# Patient Record
Sex: Female | Born: 1970 | Hispanic: No | Marital: Married | Smoking: Never smoker
Health system: Southern US, Community
[De-identification: ages and names within clinical notes are randomized; demographics above are authoritative.]

## PROBLEM LIST (undated history)

## (undated) DIAGNOSIS — E785 Hyperlipidemia, unspecified: Secondary | ICD-10-CM

## (undated) DIAGNOSIS — I10 Essential (primary) hypertension: Secondary | ICD-10-CM

## (undated) DIAGNOSIS — K219 Gastro-esophageal reflux disease without esophagitis: Secondary | ICD-10-CM

## (undated) HISTORY — DX: Gastro-esophageal reflux disease without esophagitis: K21.9

## (undated) HISTORY — DX: Essential (primary) hypertension: I10

## (undated) HISTORY — DX: Hyperlipidemia, unspecified: E78.5

---

## 2005-05-21 HISTORY — PX: CHOLECYSTECTOMY: SHX55

## 2007-12-10 ENCOUNTER — Ambulatory Visit: Payer: Self-pay | Admitting: Internal Medicine

## 2007-12-25 ENCOUNTER — Ambulatory Visit: Payer: Self-pay | Admitting: Internal Medicine

## 2008-01-26 ENCOUNTER — Ambulatory Visit: Payer: Self-pay | Admitting: Internal Medicine

## 2008-03-03 ENCOUNTER — Ambulatory Visit: Payer: Self-pay | Admitting: Internal Medicine

## 2008-07-28 ENCOUNTER — Ambulatory Visit: Payer: Self-pay | Admitting: Internal Medicine

## 2009-03-06 ENCOUNTER — Ambulatory Visit: Payer: Self-pay | Admitting: Internal Medicine

## 2009-03-28 ENCOUNTER — Ambulatory Visit: Payer: Self-pay | Admitting: Internal Medicine

## 2009-03-28 ENCOUNTER — Encounter: Admission: RE | Admit: 2009-03-28 | Discharge: 2009-03-28 | Payer: Self-pay | Admitting: Internal Medicine

## 2009-09-21 ENCOUNTER — Ambulatory Visit: Payer: Self-pay | Admitting: Internal Medicine

## 2009-11-06 ENCOUNTER — Ambulatory Visit: Payer: Self-pay | Admitting: Internal Medicine

## 2010-01-21 HISTORY — PX: BLADDER SUSPENSION: SHX72

## 2010-04-12 ENCOUNTER — Encounter: Payer: Self-pay | Admitting: Internal Medicine

## 2010-06-05 ENCOUNTER — Other Ambulatory Visit: Payer: Self-pay | Admitting: Internal Medicine

## 2010-06-14 ENCOUNTER — Other Ambulatory Visit: Payer: Self-pay | Admitting: *Deleted

## 2010-06-14 DIAGNOSIS — E785 Hyperlipidemia, unspecified: Secondary | ICD-10-CM

## 2010-06-14 MED ORDER — SIMVASTATIN 20 MG PO TABS: 20.0000 mg | ORAL_TABLET | Freq: Every evening | ORAL | Status: DC

## 2010-07-27 ENCOUNTER — Encounter: Payer: Self-pay | Admitting: Internal Medicine

## 2010-07-30 ENCOUNTER — Encounter: Payer: Self-pay | Admitting: Internal Medicine

## 2010-07-30 ENCOUNTER — Ambulatory Visit (INDEPENDENT_AMBULATORY_CARE_PROVIDER_SITE_OTHER): Payer: 59 | Admitting: Internal Medicine

## 2010-07-30 DIAGNOSIS — Z23 Encounter for immunization: Secondary | ICD-10-CM

## 2010-07-30 DIAGNOSIS — Z131 Encounter for screening for diabetes mellitus: Secondary | ICD-10-CM

## 2010-07-30 DIAGNOSIS — K219 Gastro-esophageal reflux disease without esophagitis: Secondary | ICD-10-CM

## 2010-07-30 DIAGNOSIS — D3500 Benign neoplasm of unspecified adrenal gland: Secondary | ICD-10-CM

## 2010-07-30 DIAGNOSIS — E785 Hyperlipidemia, unspecified: Secondary | ICD-10-CM

## 2010-07-30 DIAGNOSIS — F329 Major depressive disorder, single episode, unspecified: Secondary | ICD-10-CM

## 2010-07-30 DIAGNOSIS — E669 Obesity, unspecified: Secondary | ICD-10-CM

## 2010-07-30 DIAGNOSIS — Z0000 Encounter for general adult medical examination without abnormal findings: Secondary | ICD-10-CM

## 2010-07-30 DIAGNOSIS — K5900 Constipation, unspecified: Secondary | ICD-10-CM

## 2010-07-30 DIAGNOSIS — I10 Essential (primary) hypertension: Secondary | ICD-10-CM

## 2010-07-30 LAB — CBC WITH DIFFERENTIAL/PLATELET
Eosinophils Relative: 2 % (ref 0–5)
Hemoglobin: 12.4 g/dL (ref 12.0–15.0)
Lymphocytes Relative: 31 % (ref 12–46)
MCH: 26.8 pg (ref 26.0–34.0)
MCHC: 31.5 g/dL (ref 30.0–36.0)
MCV: 85.3 fL (ref 78.0–100.0)
Neutro Abs: 3.1 10*3/uL (ref 1.7–7.7)
Neutrophils Relative %: 59 % (ref 43–77)
RDW: 15.2 % (ref 11.5–15.5)
WBC: 5.3 10*3/uL (ref 4.0–10.5)

## 2010-07-30 LAB — LIPID PANEL
HDL: 55 mg/dL (ref >39)
Total CHOL/HDL Ratio: 4.6 Ratio
VLDL: 29 mg/dL (ref 0–40)

## 2010-07-30 LAB — HEPATIC FUNCTION PANEL
ALT: 55 U/L — ABNORMAL HIGH (ref 0–35)
Albumin: 4.6 g/dL (ref 3.5–5.2)
Bilirubin, Direct: 0.1 mg/dL (ref 0.0–0.3)
Total Bilirubin: 0.5 mg/dL (ref 0.3–1.2)

## 2010-07-30 LAB — POCT URINALYSIS DIPSTICK: pH, UA: 5

## 2010-07-30 MED ORDER — TETANUS-DIPHTH-ACELL PERTUSSIS 5-2.5-18.5 LF-MCG/0.5 IM SUSP
0.5000 mL | Freq: Once | INTRAMUSCULAR | Status: AC
  Administered 2010-07-30: 0.5 mL via INTRAMUSCULAR

## 2010-07-31 ENCOUNTER — Encounter: Payer: Self-pay | Admitting: Internal Medicine

## 2010-07-31 LAB — BASIC METABOLIC PANEL
CO2: 18 mEq/L — ABNORMAL LOW (ref 19–32)
Calcium: 10.1 mg/dL (ref 8.4–10.5)
Chloride: 101 mEq/L (ref 96–112)
Creat: 0.76 mg/dL (ref 0.50–1.10)
Glucose, Bld: 87 mg/dL (ref 70–99)
Potassium: 4.1 mEq/L (ref 3.5–5.3)
Sodium: 137 mEq/L (ref 135–145)

## 2010-07-31 LAB — VITAMIN D 25 HYDROXY (VIT D DEFICIENCY, FRACTURES): Vit D, 25-Hydroxy: 28 ng/mL — ABNORMAL LOW (ref 30–89)

## 2010-07-31 LAB — HEMOGLOBIN A1C
Hgb A1c MFr Bld: 6.1 % — ABNORMAL HIGH (ref <5.7)
Mean Plasma Glucose: 128 mg/dL — ABNORMAL HIGH (ref <117)

## 2010-08-20 DIAGNOSIS — I10 Essential (primary) hypertension: Secondary | ICD-10-CM | POA: Insufficient documentation

## 2010-08-20 DIAGNOSIS — D3500 Benign neoplasm of unspecified adrenal gland: Secondary | ICD-10-CM | POA: Insufficient documentation

## 2010-08-20 DIAGNOSIS — E785 Hyperlipidemia, unspecified: Secondary | ICD-10-CM | POA: Insufficient documentation

## 2010-08-20 DIAGNOSIS — K5900 Constipation, unspecified: Secondary | ICD-10-CM | POA: Insufficient documentation

## 2010-08-20 DIAGNOSIS — F329 Major depressive disorder, single episode, unspecified: Secondary | ICD-10-CM | POA: Insufficient documentation

## 2010-08-20 DIAGNOSIS — K219 Gastro-esophageal reflux disease without esophagitis: Secondary | ICD-10-CM | POA: Insufficient documentation

## 2010-08-20 NOTE — Progress Notes (Signed)
  Subjective:    Patient ID: Carolyn Combs, female    DOB: 1970/07/17, 40 y.o.   MRN: [id number]  HPI white female with history of hyperlipidemia hypertension obesity hypokalemia secondary to diuretic therapy and adult onset diabetes mellitus. History of depression [facility] with Effexor and Wellbutrin. No known drug allergies. Previous surgery included a vaginal sling. T.dap Vaccine given today. History of acute lower back pain 2011 resulting in a CT of the abdomen and pelvis since pain was radiating into her groin. No acute appendicitis. Patient was constipated. There is also a probable 2 cm adrenal adenoma left adrenal gland noted on CT in 2011.  She had her husband are separated. She completed 4 years of college and works as a Consulting civil engineer and a Horticulturist, commercial for [employer]. Does not smoke. Social alcohol consumption. 2 children.  Family history mother with history of breast cancer asthma hypertension COPD. Maternal grandmother with anxiety. Father in good health. One sister in good health.     Review of Systems  Constitutional: Positive for activity change and fatigue.  HENT: Negative.   Eyes: Negative.   Respiratory: Negative.   Cardiovascular: Negative.   Gastrointestinal: Positive for constipation.  Genitourinary: Negative.   Musculoskeletal: Negative.   Neurological: Negative.   Hematological: Negative.   Psychiatric/Behavioral: Positive for dysphoric mood.       Objective:   Physical Exam  Vitals reviewed. Constitutional: She is oriented to person, place, and time. She appears well-nourished.  HENT:  Head: Normocephalic and atraumatic.  Right Ear: External ear normal.  Left Ear: External ear normal.  Mouth/Throat: Oropharynx is clear and moist. No oropharyngeal exudate.  Eyes: Conjunctivae and EOM are normal. Pupils are equal, round, and reactive to light.  Neck: Neck supple. No JVD present. No thyromegaly present.  Cardiovascular: Normal  rate, regular rhythm and normal heart sounds.   Pulmonary/Chest: Effort normal and breath sounds normal. She has no wheezes. She has no rales.  Abdominal: Soft. Bowel sounds are normal.  Musculoskeletal: She exhibits no edema.  Lymphadenopathy:    She has no cervical adenopathy.  Neurological: She is alert and oriented to person, place, and time. She has normal reflexes. No cranial nerve deficit.  Skin: Skin is warm and dry. No rash noted. She is not diaphoretic.  Psychiatric: She has a normal mood and affect. Her behavior is normal.          Assessment & Plan:   Hypertension  Hyperlipidemia  GE reflux  Obesity  Hypokalemia secondary to diuretic therapy  Adult onset diabetes mellitus  Depression  Probable left adrenal adenoma  Return in 8 weeks. Need to reassess blood pressure. Consider increasing Ramipril to 10 mg daily. Under stress with separation

## 2010-08-31 ENCOUNTER — Other Ambulatory Visit: Payer: Self-pay | Admitting: Internal Medicine

## 2010-09-06 ENCOUNTER — Other Ambulatory Visit: Payer: Self-pay | Admitting: Internal Medicine

## 2010-09-11 ENCOUNTER — Encounter: Payer: Self-pay | Admitting: Internal Medicine

## 2010-09-11 ENCOUNTER — Ambulatory Visit
Admission: RE | Admit: 2010-09-11 | Discharge: 2010-09-11 | Disposition: A | Discharge status: Home / Self Care | Payer: 59 | Source: Ambulatory Visit | Attending: Internal Medicine | Admitting: Internal Medicine

## 2010-09-30 ENCOUNTER — Other Ambulatory Visit: Payer: Self-pay | Admitting: Internal Medicine

## 2010-10-01 ENCOUNTER — Other Ambulatory Visit: Payer: 59 | Admitting: Internal Medicine

## 2010-10-01 LAB — HEPATIC FUNCTION PANEL
Alkaline Phosphatase: 117 U/L (ref 39–117)
Bilirubin, Direct: 0.1 mg/dL (ref 0.0–0.3)

## 2010-10-02 ENCOUNTER — Ambulatory Visit (INDEPENDENT_AMBULATORY_CARE_PROVIDER_SITE_OTHER): Payer: 59 | Admitting: Internal Medicine

## 2010-10-02 ENCOUNTER — Encounter: Payer: Self-pay | Admitting: Internal Medicine

## 2010-10-02 VITALS — BP 129/89 | HR 82 | Temp 96.8°F | Ht 65.0 in | Wt 302.0 lb

## 2010-10-02 DIAGNOSIS — I10 Essential (primary) hypertension: Secondary | ICD-10-CM

## 2010-10-02 DIAGNOSIS — F3289 Other specified depressive episodes: Secondary | ICD-10-CM

## 2010-10-02 DIAGNOSIS — E785 Hyperlipidemia, unspecified: Secondary | ICD-10-CM

## 2010-10-02 DIAGNOSIS — F329 Major depressive disorder, single episode, unspecified: Secondary | ICD-10-CM

## 2010-10-02 NOTE — Progress Notes (Signed)
  Subjective:    Patient ID: Carolyn Combs, female    DOB: 06/13/1970, 40 y.o.   MRN: [id number]  HPI for followup of hypertension and depression. She is on Altace 10 mg daily, HCTZ, generic Zocor, Wellbutrin, lorazepam, and Effexor. History of GE reflux treated with Prilosec. Has 2 small children and is going through a divorce. Is morbidly obese. Not really motivated to diet and exercise which is concerning. Separated from husband. He has moved into another home and has a girlfriend. Patient has psychiatrist handling her antidepressant and anxiety medication.    Review of Systems     Objective:   Physical Exam chest clear to auscultation; cardiac exam regular rate and rhythm; extremities without edema.        Assessment & Plan:  Hypertension  Hyperlipidemia  Depression  Morbid obesity  GE reflux  Plan: Patient is return in 6 months

## 2010-10-14 ENCOUNTER — Other Ambulatory Visit: Payer: Self-pay | Admitting: Internal Medicine

## 2010-10-17 ENCOUNTER — Institutional Professional Consult (permissible substitution): Payer: 59 | Admitting: Pulmonary Disease

## 2010-10-31 ENCOUNTER — Ambulatory Visit (INDEPENDENT_AMBULATORY_CARE_PROVIDER_SITE_OTHER): Payer: 59 | Admitting: Pulmonary Disease

## 2010-10-31 ENCOUNTER — Encounter: Payer: Self-pay | Admitting: Pulmonary Disease

## 2010-10-31 VITALS — BP 118/84 | HR 92 | Temp 98.4°F | Ht 64.0 in | Wt 314.4 lb

## 2010-10-31 DIAGNOSIS — G4733 Obstructive sleep apnea (adult) (pediatric): Secondary | ICD-10-CM

## 2010-10-31 NOTE — Progress Notes (Signed)
  Subjective:    Patient ID: Carolyn Combs, female    DOB: [birth year], 40 y.o.   MRN: [id number]  HPI The patient is a 40 year old female who I have been asked to see for possible sleep apnea.  She has been noted to have loud snoring, as well as an abnormal breathing pattern during sleep according to her family.  She has frequent awakenings at night, and is not rested in the mornings upon arising.  She has significant sleep pressure during the day while at work, and dozes frequently at her computer.  She is not as sleepy in the evenings, but stays very busy taking care of her kids.  She notes some sleep pressure with driving, but it is not consistent.  She states that her weight is up 75 pounds over the last 2 years, and her Epworth score today is 16.  Sleep Questionnaire: What time do you typically go to bed?( Between what hours) 10:30 to 11:30 pm How long does it take you to fall asleep? 30 mins How many times during the night do you wake up? 7 What time do you get out of bed to start your day? 0600 Do you drive or operate heavy machinery in your occupation? No How much has your weight changed (up or down) over the past two years? (In pounds) 75 lb (34.02 kg) Have you ever had a sleep study before? No Do you currently use CPAP? No Do you wear oxygen at any time? No     Review of Systems  Constitutional: Positive for unexpected weight change. Negative for fever.  HENT: Negative for ear pain, nosebleeds, congestion, sore throat, rhinorrhea, sneezing, trouble swallowing, dental problem, postnasal drip and sinus pressure.   Eyes: Negative for redness and itching.  Respiratory: Positive for shortness of breath. Negative for cough, chest tightness and wheezing.   Cardiovascular: Negative for palpitations and leg swelling.  Gastrointestinal: Negative for nausea and vomiting.  Genitourinary: Negative for dysuria.  Musculoskeletal: Negative for joint swelling.  Skin: Negative for rash.  Neurological:  Negative for headaches.  Hematological: Does not bruise/bleed easily.  Psychiatric/Behavioral: Positive for dysphoric mood. The patient is nervous/anxious.        Objective:   Physical Exam Constitutional:  Obese female, no acute distress  HENT:  Nares patent without discharge  Oropharynx without exudate, palate and uvula are elongated, +tonsillar hypertrophy  Eyes:  Perrla, eomi, no scleral icterus  Neck:  No JVD, no TMG  Cardiovascular:  Normal rate, regular rhythm, no rubs or gallops.  No murmurs        Intact distal pulses  Pulmonary :  Normal breath sounds, no stridor or respiratory distress   No rales, rhonchi, or wheezing  Abdominal:  Soft, nondistended, bowel sounds present.  No tenderness noted.   Musculoskeletal:  No lower extremity edema noted.  Lymph Nodes:  No cervical lymphadenopathy noted  Skin:  No cyanosis noted  Neurologic:  Alert, appropriate, moves all 4 extremities without obvious deficit.         Assessment & Plan:

## 2010-10-31 NOTE — Patient Instructions (Signed)
Will set up for home sleep testing.  Will arrange followup visit to discuss once results available.

## 2010-10-31 NOTE — Assessment & Plan Note (Signed)
The patient's history is very suspicious for significant obstructive sleep apnea.  She has snoring, an abnormal breathing pattern during sleep, nonrestorative sleep, and significant daytime sleepiness.  I have had a long discussion with the pt about sleep apnea, including its impact on QOL and CV health.  I have recommended that she had a sleep study, and the patient is agreeable.  I will arrange followup once the results are available.

## 2010-11-03 ENCOUNTER — Other Ambulatory Visit: Payer: Self-pay | Admitting: Internal Medicine

## 2010-11-08 ENCOUNTER — Ambulatory Visit (INDEPENDENT_AMBULATORY_CARE_PROVIDER_SITE_OTHER): Payer: 59 | Admitting: Pulmonary Disease

## 2010-11-08 DIAGNOSIS — G4733 Obstructive sleep apnea (adult) (pediatric): Secondary | ICD-10-CM

## 2010-11-14 ENCOUNTER — Other Ambulatory Visit: Payer: Self-pay | Admitting: Internal Medicine

## 2010-11-15 ENCOUNTER — Other Ambulatory Visit: Payer: Self-pay

## 2010-11-15 MED ORDER — RAMIPRIL 10 MG PO CAPS: 10.0000 mg | ORAL_CAPSULE | Freq: Every day | ORAL | Status: DC

## 2010-11-16 ENCOUNTER — Telehealth: Payer: Self-pay | Admitting: *Deleted

## 2010-11-16 NOTE — Telephone Encounter (Signed)
Pt needs ov with KC to discuss sleep study results LMOM for pt TCB 

## 2010-11-19 NOTE — Telephone Encounter (Signed)
[id number]

## 2010-11-19 NOTE — Telephone Encounter (Signed)
Carolyn Combs will schedule the pt in a hold slot on kc's schedule per megan--nothing further needed for this msg

## 2010-11-20 ENCOUNTER — Encounter: Payer: Self-pay | Admitting: Pulmonary Disease

## 2010-11-20 ENCOUNTER — Ambulatory Visit (INDEPENDENT_AMBULATORY_CARE_PROVIDER_SITE_OTHER): Payer: 59 | Admitting: Pulmonary Disease

## 2010-11-20 VITALS — BP 112/68 | HR 97 | Temp 97.7°F | Ht 64.0 in | Wt 321.2 lb

## 2010-11-20 DIAGNOSIS — G4733 Obstructive sleep apnea (adult) (pediatric): Secondary | ICD-10-CM

## 2010-11-20 NOTE — Patient Instructions (Signed)
Will start on cpap at moderate pressure level.  Please call if having tolerance issues. Work on weight reduction followup with me in 5 weeks.

## 2010-11-20 NOTE — Progress Notes (Signed)
  Subjective:    Patient ID: Carolyn Combs, female    DOB: 09/07/1970, 40 y.o.   MRN: [id number]  HPI The patient comes in today for followup of her recent home sleep test.  She was found to have mild obstructive sleep apnea, with an apnea hypopnea index of 12 events per hour.  I have reviewed the study in detail with her, and answered all of her questions.   Review of Systems  Constitutional: Negative for fever and unexpected weight change.  HENT: Positive for sore throat, postnasal drip and sinus pressure. Negative for ear pain, nosebleeds, congestion, rhinorrhea, sneezing, trouble swallowing and dental problem.   Eyes: Negative for redness and itching.  Respiratory: Positive for cough. Negative for chest tightness, shortness of breath and wheezing.   Cardiovascular: Negative for palpitations and leg swelling.  Gastrointestinal: Negative for nausea and vomiting.  Genitourinary: Negative for dysuria.  Musculoskeletal: Negative for joint swelling.  Skin: Negative for rash.  Neurological: Negative for headaches.  Hematological: Does not bruise/bleed easily.  Psychiatric/Behavioral: Negative for dysphoric mood. The patient is not nervous/anxious.        Objective:   Physical Exam Obese female in no acute distress Nose without purulence or discharge noted Lower extremities without edema, no cyanosis Appears mildly sleepy, moves all 4 extremities.       Assessment & Plan:

## 2010-11-20 NOTE — Assessment & Plan Note (Signed)
The patient has mild obstructive sleep apnea by her recent sleep test, but has significant daytime symptoms and nonrestorative sleep.  I have outlined a conservative approach with a trial of weight loss alone, as well as a more aggressive approach with treatment while working on weight loss.  The patient feels that she would like to treat this because of her daytime symptoms, and I have discussed the options of surgery, dental appliance, and CPAP.  After a lengthy discussion, and the patient and I have decided that a trial of CPAP would be best while she is working on weight reduction. I will set the patient up on cpap at a moderate pressure level to allow for desensitization, and will troubleshoot the device over the next 4-6weeks if needed.  The pt is to call me if having issues with tolerance.  Will then optimize the pressure once patient is able to wear cpap on a consistent basis.

## 2010-12-25 ENCOUNTER — Ambulatory Visit: Payer: 59 | Admitting: Pulmonary Disease

## 2010-12-28 ENCOUNTER — Other Ambulatory Visit: Payer: Self-pay | Admitting: Internal Medicine

## 2011-01-11 ENCOUNTER — Ambulatory Visit (INDEPENDENT_AMBULATORY_CARE_PROVIDER_SITE_OTHER): Payer: 59 | Admitting: Pulmonary Disease

## 2011-01-11 ENCOUNTER — Encounter: Payer: Self-pay | Admitting: Pulmonary Disease

## 2011-01-11 VITALS — BP 110/72 | HR 106 | Temp 98.3°F | Ht 64.0 in | Wt 321.8 lb

## 2011-01-11 DIAGNOSIS — G4733 Obstructive sleep apnea (adult) (pediatric): Secondary | ICD-10-CM

## 2011-01-11 NOTE — Assessment & Plan Note (Signed)
The patient has done very well with CPAP, and feels that it has made a significant difference in her sleep and daytime alertness.  She is having no issues with the mask or pressure.  I have told her that we need to optimize her pressure, and I have also encouraged her to work aggressively on weight loss. Care Plan:  At this point, will arrange for the patient's machine to be changed over to auto mode for 2 weeks to optimize their pressure.  I will review the downloaded data once sent by dme, and also evaluate for compliance, leaks, and residual osa.  I will call the patient and dme to discuss the results, and have the patient's machine set appropriately.  This will serve as the pt's cpap pressure titration.

## 2011-01-11 NOTE — Progress Notes (Signed)
  Subjective:    Patient ID: Carolyn Combs, female    DOB: October 15, 1970, 40 y.o.   MRN: [id number]  HPI Patient comes in today for followup of her obstructive sleep apnea.  She was started on CPAP at the last visit, and has done very well with the device.  She denies any issues with mask fit or pressure, and feels that her sleep is much improved.  She is also seeing a significant increase in her daytime alertness.  Overall her experience has been very positive.   Review of Systems  Constitutional: Negative for fever and unexpected weight change.  HENT: Positive for congestion and sinus pressure. Negative for ear pain, nosebleeds, sore throat, rhinorrhea, sneezing, trouble swallowing, dental problem and postnasal drip.   Eyes: Negative for redness and itching.  Respiratory: Negative for cough, chest tightness, shortness of breath and wheezing.   Cardiovascular: Negative for palpitations and leg swelling.  Gastrointestinal: Negative for nausea and vomiting.  Genitourinary: Negative for dysuria.  Musculoskeletal: Negative for joint swelling.  Skin: Negative for rash.  Neurological: Negative for headaches.  Hematological: Does not bruise/bleed easily.  Psychiatric/Behavioral: Negative for dysphoric mood. The patient is not nervous/anxious.        Objective:   Physical Exam Obese female in no acute distress  no skin breakdown or pressure necrosis from the CPAP mask Lower extremities without significant edema, no cyanosis noted Alert, does not appear to be sleepy, moves all 4 extremities.        Assessment & Plan:

## 2011-01-11 NOTE — Patient Instructions (Signed)
Will use the auto setting on your machine to optimize your pressure for 2 weeks.  Will call you with the results once I receive download. Work on weight loss followup with me in 6mos, but call if having any issues with cpap

## 2011-01-31 ENCOUNTER — Ambulatory Visit (INDEPENDENT_AMBULATORY_CARE_PROVIDER_SITE_OTHER): Payer: 59 | Admitting: Internal Medicine

## 2011-01-31 ENCOUNTER — Encounter: Payer: Self-pay | Admitting: Internal Medicine

## 2011-01-31 VITALS — BP 130/82 | HR 88 | Temp 97.8°F | Ht 65.0 in | Wt 306.0 lb

## 2011-01-31 DIAGNOSIS — F341 Dysthymic disorder: Secondary | ICD-10-CM

## 2011-01-31 DIAGNOSIS — F329 Major depressive disorder, single episode, unspecified: Secondary | ICD-10-CM

## 2011-01-31 DIAGNOSIS — R5383 Other fatigue: Secondary | ICD-10-CM

## 2011-01-31 LAB — CBC WITH DIFFERENTIAL/PLATELET
Hemoglobin: 13.1 g/dL (ref 12.0–15.0)
Lymphocytes Relative: 29 % (ref 12–46)
Lymphs Abs: 2.1 10*3/uL (ref 0.7–4.0)
MCH: 27.1 pg (ref 26.0–34.0)
MCHC: 30.8 g/dL (ref 30.0–36.0)
RBC: 4.84 MIL/uL (ref 3.87–5.11)
RDW: 14.2 % (ref 11.5–15.5)
WBC: 7.2 10*3/uL (ref 4.0–10.5)

## 2011-01-31 LAB — TSH: TSH: 3.481 u[IU]/mL (ref 0.350–4.500)

## 2011-01-31 LAB — HEMOGLOBIN A1C
Hgb A1c MFr Bld: 6 % — ABNORMAL HIGH (ref <5.7)
Mean Plasma Glucose: 126 mg/dL — ABNORMAL HIGH (ref <117)

## 2011-02-04 ENCOUNTER — Telehealth: Payer: Self-pay

## 2011-02-04 NOTE — Telephone Encounter (Signed)
Increase  To 100 mg- 2 tablets daily

## 2011-02-04 NOTE — Telephone Encounter (Signed)
Patient had OV last Thursday, and you changed her meds from Venlafexine XR 300mg  to Pristiq 50mg  daily. Started it on Saturday and states her head feels weird, and has some nausea.

## 2011-02-04 NOTE — Telephone Encounter (Signed)
Patient informed. 

## 2011-02-15 ENCOUNTER — Encounter: Payer: Self-pay | Admitting: Internal Medicine

## 2011-02-15 ENCOUNTER — Ambulatory Visit: Payer: 59 | Admitting: Internal Medicine

## 2011-02-15 ENCOUNTER — Ambulatory Visit (INDEPENDENT_AMBULATORY_CARE_PROVIDER_SITE_OTHER): Payer: 59 | Admitting: Internal Medicine

## 2011-02-15 VITALS — BP 122/84 | HR 88 | Temp 98.2°F | Wt 306.0 lb

## 2011-02-15 DIAGNOSIS — F329 Major depressive disorder, single episode, unspecified: Secondary | ICD-10-CM

## 2011-02-15 DIAGNOSIS — I10 Essential (primary) hypertension: Secondary | ICD-10-CM

## 2011-02-15 DIAGNOSIS — G4730 Sleep apnea, unspecified: Secondary | ICD-10-CM

## 2011-02-15 NOTE — Progress Notes (Signed)
  Subjective:    Patient ID: Carolyn Combs, female    DOB: Oct 10, 1970, 40 y.o.   MRN: [id number]  HPI  At last visit 2 weeks ago, we changed her antidepressant from Effexor to Pristiq. She started out with Pristiq 50 mg daily and within just a few short days she felt a bit disoriented with some numbness in her face. We increased Pristiqt to 100 mg daily and now she feels quite well. Thinks it's even better than  Effexor. Is starting to feel more motivated. Lab work was reviewed. Patient says she had influenza immunization at work November 2012. Says she had Pap smear at [facility] May 2012    Review of Systems     Objective:   Physical Exam chest clear; cardiac exam regular rate and rhythm; neck no thyromegaly        Assessment & Plan:  Depression  Morbid obesity  Plan: Continue prostatic 100 mg daily. Recheck in 6 weeks. Upon return, give consideration to patient being referred for bariatric surgical consultation

## 2011-02-15 NOTE — Patient Instructions (Signed)
Continue prostatic 100 mg daily. Return in 6 weeks.

## 2011-03-12 ENCOUNTER — Encounter: Payer: Self-pay | Admitting: Internal Medicine

## 2011-03-12 ENCOUNTER — Ambulatory Visit (INDEPENDENT_AMBULATORY_CARE_PROVIDER_SITE_OTHER): Payer: 59 | Admitting: Internal Medicine

## 2011-03-12 VITALS — BP 122/80 | HR 92 | Temp 98.3°F | Wt 299.0 lb

## 2011-03-12 DIAGNOSIS — T887XXA Unspecified adverse effect of drug or medicament, initial encounter: Secondary | ICD-10-CM

## 2011-03-12 DIAGNOSIS — F329 Major depressive disorder, single episode, unspecified: Secondary | ICD-10-CM

## 2011-03-12 DIAGNOSIS — F411 Generalized anxiety disorder: Secondary | ICD-10-CM

## 2011-03-12 DIAGNOSIS — F419 Anxiety disorder, unspecified: Secondary | ICD-10-CM

## 2011-03-14 ENCOUNTER — Encounter: Payer: Self-pay | Admitting: Internal Medicine

## 2011-03-14 DIAGNOSIS — F411 Generalized anxiety disorder: Secondary | ICD-10-CM

## 2011-03-14 DIAGNOSIS — F329 Major depressive disorder, single episode, unspecified: Secondary | ICD-10-CM

## 2011-03-14 NOTE — Progress Notes (Signed)
  Subjective:    Patient ID: Carolyn Combs, female    DOB: Jul 05, 1970, 41 y.o.   MRN: [id number]  HPI 41 year old white female with morbid obesity in today with significant anxiety issues and perhaps side effect of recent trial of Pristiq. Initially felt better with Pristiq but began to notice she had trouble controlling movement in her left leg. Leg wanted to move uncontrollably. It was almost like restless leg syndrome. We have seen one other patient to had similar symptoms on Pristiq. Pristiq was prescribed increase her energy level. She had worked up to 100 mg daily. She is having trouble sitting still at her work. Her boss has mentioned this to her. She cannot focus and has been unproductive at work. She is anxious and depressed. She moved in with her father which was a big change for her. She is going through a divorce. She has 2 small children. She has trouble getting motivated to do housework. Trouble getting organized. Depression symptoms have been going on for some time. She used to see counselor at [facility] but no longer does that. She tried to get an appointment with Ollen Gross but her husband has been seeing Ollen Gross and Ms. Andrey Campanile felt that was a conflict. We gave her the name of another counselor to try. She is definitely needs considerable amount of counseling and support. Denies being suicidal. She spoke to someone in her human resources Department at work today. She would like to take Aleve of absence from work. Says she simply cannot focus and get anything done. Admits to being quite anxious. She does have lorazepam to take for anxiety.    Review of Systems     Objective:   Physical Exam alert and oriented x3, no focal deficits on brief neurological exam, conversation is appropriate. She is tearful in the office today.        Assessment & Plan:  Anxiety depression  Morbid obesity  Situational stress at home and work  Plan: Change to Vibryd 20 mg daily for 3 days  increasing to 40 mg daily. Samples provided. Lorazepam 1 mg by mouth twice daily or 3 times daily as needed for anxiety. Return in 7-10 days. She tells me there will be some short-term disability forms to complete. Out of work until reassessed.

## 2011-03-14 NOTE — Patient Instructions (Signed)
Note given to be out of work until further notice. Stop Pristiq. Change to Vibryd 20 mg daily for 3 days increasing to 40 mg daily. Return in 7-10 days. Take Ativan for anxiety.

## 2011-03-17 ENCOUNTER — Encounter: Payer: Self-pay | Admitting: Internal Medicine

## 2011-03-17 NOTE — Progress Notes (Signed)
  Subjective:    Patient ID: Carolyn Combs, female    DOB: 1970/02/20, 41 y.o.   MRN: [id number]  HPI 41 year old white obese female with multiple medical problems in today asking me to take over prescribing her antidepressant medication. [facility] but does not want to return there. Feels that medications are not working. She is going through a divorce. She has 2 small children. Says that her husband will be moving into her house and she will be moving either into a condo or in with her father. Her husband is also a patient here. He has indicated that she has trouble getting organized around the house and has issues with fatigue which she feels interferes with her daily activities. 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 Doesn't find it very interesting. Main complaint is lack of energy. Sleeps poorly as well. Has anxiety and depression. Feels both tired and worried. Is not suicidal.    Review of Systems     Objective:   Physical Exam spent 30 minutes talking with patient today. She has not tried newer antidepressants such as Vibryd or Pristiq.        Assessment & Plan:  Anxiety depression  Plan: Patient was given samples of Pristiq 50 mg daily and we'll reassess in 2 weeks.

## 2011-03-17 NOTE — Patient Instructions (Signed)
Start Pristiq 50 mg daily and return in 2-3 weeks

## 2011-03-21 ENCOUNTER — Encounter: Payer: Self-pay | Admitting: Internal Medicine

## 2011-03-21 ENCOUNTER — Ambulatory Visit (INDEPENDENT_AMBULATORY_CARE_PROVIDER_SITE_OTHER): Payer: 59 | Admitting: Internal Medicine

## 2011-03-21 VITALS — BP 108/72 | HR 76 | Temp 97.3°F | Wt 299.0 lb

## 2011-03-21 DIAGNOSIS — F329 Major depressive disorder, single episode, unspecified: Secondary | ICD-10-CM

## 2011-03-21 DIAGNOSIS — R5383 Other fatigue: Secondary | ICD-10-CM

## 2011-03-21 DIAGNOSIS — Z733 Stress, not elsewhere classified: Secondary | ICD-10-CM

## 2011-03-21 DIAGNOSIS — F32A Depression, unspecified: Secondary | ICD-10-CM

## 2011-03-21 DIAGNOSIS — F439 Reaction to severe stress, unspecified: Secondary | ICD-10-CM

## 2011-03-21 NOTE — Patient Instructions (Signed)
Take Vibryd 40 mg daily. He may return to work Monday, March 4. Return in 2 weeks for physical examination and followup

## 2011-03-21 NOTE — Progress Notes (Signed)
  Subjective:    Patient ID: Carolyn Combs, female    DOB: 12/01/1970, 41 y.o.   MRN: [id number]  HPI  41 year old orbitally obese white female with history of hypertension, anxiety, depression, situational stress is now out on medical leave from work. Forms were completed for her employer on February 21 and faxed to the employer. This included an FMLA form. Patient says that she has benefited from being out of work and feels she may be ready now to return to work. She thinks lorazepam was causing her to have some restless leg type issues. She stopped taking it and feels a bit better. She is now on Vibryd 40 mg daily without any side effects. Wants to stay off anxiety medication for now. She did see a psychologist for counseling. Is scheduled to go once a week for a while. She is now living with her father. Feels that she's getting a bit more organized.    Review of Systems     Objective:   Physical Exam alert and oriented x3. No suicidal ideations. Affect is normal. Seems less stressed. Thought process normal        Assessment & Plan:  Anxiety  Depression  Fatigue  Morbid obesity  Hypertension  Situational stress  Plan: Return in 2 weeks. We had land for her to have physical exam at this time several months ago says she will keep that appointment. Note given to her to go back to work on Monday, March 4.

## 2011-03-29 ENCOUNTER — Ambulatory Visit: Payer: 59 | Admitting: Internal Medicine

## 2011-04-04 ENCOUNTER — Other Ambulatory Visit: Payer: Self-pay | Admitting: Internal Medicine

## 2011-04-04 ENCOUNTER — Other Ambulatory Visit: Payer: 59 | Admitting: Internal Medicine

## 2011-04-04 DIAGNOSIS — Z0000 Encounter for general adult medical examination without abnormal findings: Secondary | ICD-10-CM

## 2011-04-04 LAB — COMPREHENSIVE METABOLIC PANEL
Chloride: 102 mEq/L (ref 96–112)
Creat: 0.77 mg/dL (ref 0.50–1.10)
Potassium: 4.1 mEq/L (ref 3.5–5.3)
Sodium: 139 mEq/L (ref 135–145)
Total Bilirubin: 0.5 mg/dL (ref 0.3–1.2)

## 2011-04-04 LAB — CBC WITH DIFFERENTIAL/PLATELET
Basophils Absolute: 0 10*3/uL (ref 0.0–0.1)
Basophils Relative: 0 % (ref 0–1)
Eosinophils Absolute: 0.1 10*3/uL (ref 0.0–0.7)
HCT: 40.1 % (ref 36.0–46.0)
Hemoglobin: 12.4 g/dL (ref 12.0–15.0)
Lymphocytes Relative: 32 % (ref 12–46)
MCHC: 30.9 g/dL (ref 30.0–36.0)
MCV: 87.6 fL (ref 78.0–100.0)
Monocytes Relative: 6 % (ref 3–12)
WBC: 5.2 10*3/uL (ref 4.0–10.5)

## 2011-04-04 LAB — TSH: TSH: 3.719 u[IU]/mL (ref 0.350–4.500)

## 2011-04-04 LAB — LIPID PANEL: HDL: 51 mg/dL (ref >39)

## 2011-04-05 ENCOUNTER — Encounter: Payer: Self-pay | Admitting: Internal Medicine

## 2011-04-05 ENCOUNTER — Ambulatory Visit (INDEPENDENT_AMBULATORY_CARE_PROVIDER_SITE_OTHER): Payer: 59 | Admitting: Internal Medicine

## 2011-04-05 VITALS — BP 126/76 | HR 80 | Temp 98.6°F | Ht 64.25 in | Wt 306.0 lb

## 2011-04-05 DIAGNOSIS — F419 Anxiety disorder, unspecified: Secondary | ICD-10-CM

## 2011-04-05 DIAGNOSIS — E785 Hyperlipidemia, unspecified: Secondary | ICD-10-CM

## 2011-04-05 DIAGNOSIS — R7309 Other abnormal glucose: Secondary | ICD-10-CM

## 2011-04-05 DIAGNOSIS — F411 Generalized anxiety disorder: Secondary | ICD-10-CM

## 2011-04-05 DIAGNOSIS — K760 Fatty (change of) liver, not elsewhere classified: Secondary | ICD-10-CM

## 2011-04-05 DIAGNOSIS — I10 Essential (primary) hypertension: Secondary | ICD-10-CM

## 2011-04-05 DIAGNOSIS — F329 Major depressive disorder, single episode, unspecified: Secondary | ICD-10-CM

## 2011-04-05 DIAGNOSIS — F32A Depression, unspecified: Secondary | ICD-10-CM

## 2011-04-05 DIAGNOSIS — K7689 Other specified diseases of liver: Secondary | ICD-10-CM

## 2011-04-05 DIAGNOSIS — R7302 Impaired glucose tolerance (oral): Secondary | ICD-10-CM

## 2011-04-05 DIAGNOSIS — Z0000 Encounter for general adult medical examination without abnormal findings: Secondary | ICD-10-CM

## 2011-04-05 LAB — VITAMIN D 25 HYDROXY (VIT D DEFICIENCY, FRACTURES): Vit D, 25-Hydroxy: 26 ng/mL — ABNORMAL LOW (ref 30–89)

## 2011-04-05 LAB — POCT URINALYSIS DIPSTICK
Bilirubin, UA: NEGATIVE
Ketones, UA: NEGATIVE
Spec Grav, UA: 1.025
Urobilinogen, UA: NEGATIVE

## 2011-04-05 LAB — HEMOGLOBIN A1C
Hgb A1c MFr Bld: 6.2 % — ABNORMAL HIGH (ref <5.7)
Mean Plasma Glucose: 131 mg/dL — ABNORMAL HIGH (ref <117)

## 2011-04-05 MED ORDER — AMPHETAMINE-DEXTROAMPHET ER 20 MG PO CP24: 20.0000 mg | ORAL_CAPSULE | ORAL | Status: DC

## 2011-04-16 ENCOUNTER — Other Ambulatory Visit: Payer: Self-pay | Admitting: Internal Medicine

## 2011-04-21 ENCOUNTER — Encounter: Payer: Self-pay | Admitting: Internal Medicine

## 2011-04-21 DIAGNOSIS — K760 Fatty (change of) liver, not elsewhere classified: Secondary | ICD-10-CM | POA: Insufficient documentation

## 2011-04-21 DIAGNOSIS — F419 Anxiety disorder, unspecified: Secondary | ICD-10-CM | POA: Insufficient documentation

## 2011-04-21 NOTE — Patient Instructions (Signed)
Try Adderall to see if concentration at work will improve. Continue with Vibryd. Return in 8 weeks. Continue other medication. Consider diet exercise and weight loss.

## 2011-04-21 NOTE — Progress Notes (Signed)
Subjective:    Patient ID: Carolyn Combs, female    DOB: March 18, 1970, 41 y.o.   MRN: [id number]  HPI 41 year old white female with history of morbid obesity, GE reflux, hyperlipidemia, hypertension, hypokalemia secondary to diuretic therapy, impaired glucose tolerance, anxiety, depression, constipation for evaluation of medical problems and health maintenance. History of mild elevation of SGOT and SGPT due to fatty liver infiltration. History of anxiety depression which has taken a toll on her recently. She's been having difficulty concentrating at work. Not having any energy and doesn't want to get out of bed in the morning. This improved with Pristiq but she had side effects with that and now she is taking Vibryd.  Past medical history: patient hospitalized with RMSF in 1977. Hospitalized twice for childbirth in 2003 in 2004. Fractured left orbit due to accident with softball. Wisdom teeth extraction. Surgery to repair fractured left orbit 1992. History of vaginal sling procedure for incontinence. Social history: patient works for [employer], husband is a Radiation protection practitioner. They are separated and living apart. She has 2 children. He wants a divorce. She is comfortable with that. Have suggested counseling for her and she has found counselor that will accept her insurance.  Family history: Father in good health. Mother age 8 in poor health with history of breast cancer, COPD, hypertension. Paternal grandmother with history of anxiety. One sister in good health.    Review of Systems  Constitutional: Positive for fatigue.  HENT: Negative.   Eyes: Negative.   Cardiovascular: Negative.   Gastrointestinal:       GE reflux  Genitourinary: Negative.   Neurological: Negative.   Hematological: Negative.   Psychiatric/Behavioral: Positive for dysphoric mood.       Anxiety and depression. Insomnia       Objective:   Physical Exam  Vitals reviewed. Constitutional: She is oriented to person, place, and  time. She appears well-developed and well-nourished. No distress.  HENT:  Head: Normocephalic and atraumatic.  Right Ear: External ear normal.  Left Ear: External ear normal.  Mouth/Throat: Oropharynx is clear and moist. No oropharyngeal exudate.  Eyes: Conjunctivae and EOM are normal. Pupils are equal, round, and reactive to light. Right eye exhibits no discharge. Left eye exhibits no discharge. No scleral icterus.  Neck: Neck supple. No JVD present. No thyromegaly present.  Cardiovascular: Normal rate, regular rhythm, normal heart sounds and intact distal pulses.   No murmur heard. Pulmonary/Chest: Effort normal and breath sounds normal. She has no wheezes. She has no rales.       Breasts normal female  Abdominal: Soft. Bowel sounds are normal. She exhibits no distension and no mass. There is no tenderness. There is no rebound and no guarding.  Genitourinary:       Deferred  Musculoskeletal: Normal range of motion. She exhibits no edema.  Lymphadenopathy:    She has no cervical adenopathy.  Neurological: She is alert and oriented to person, place, and time. She has normal reflexes. No cranial nerve deficit. Coordination normal.  Skin: She is not diaphoretic.  Psychiatric: Her behavior is normal. Judgment and thought content normal.       Some dysphoria          Assessment & Plan:  Depression  Anxiety  Morbid obesity  GE reflux  Hyperlipidemia  Hypertension  Hypokalemia secondary to diuretic therapy  History of fractured left orbit surgically repaired  Impaired glucose tolerance  History of vaginal sling for incontinence  History of fatty liver infiltration  Plan: Patient doing better  with regard anxiety and depression issues. She is to continue counseling and return in 8 weeks. We'll going to try Adderall to see if concentration at work will improve.

## 2011-05-17 ENCOUNTER — Encounter: Payer: Self-pay | Admitting: Internal Medicine

## 2011-05-17 ENCOUNTER — Ambulatory Visit (INDEPENDENT_AMBULATORY_CARE_PROVIDER_SITE_OTHER): Payer: 59 | Admitting: Internal Medicine

## 2011-05-17 VITALS — BP 120/90 | Wt 299.0 lb

## 2011-05-17 DIAGNOSIS — R5381 Other malaise: Secondary | ICD-10-CM

## 2011-05-17 DIAGNOSIS — R5383 Other fatigue: Secondary | ICD-10-CM

## 2011-05-17 NOTE — Patient Instructions (Signed)
Continue same meds and return in 4 months 

## 2011-05-20 ENCOUNTER — Encounter: Payer: Self-pay | Admitting: Internal Medicine

## 2011-05-20 NOTE — Progress Notes (Signed)
  Subjective:    Patient ID: Carolyn Combs, female    DOB: 06/27/1970, 41 y.o.   MRN: [id number]  HPI Patient in today for followup on depression and fatigue. At last visit I placed her on Adderall to see if it would help with her concentration at work and help combat fatigue. Says it has made a world of difference in the way she feels. Actually feels good now. Says dose of Adderall for seems about write to her at the present time. No problem with antidepressant. Sleeping better.    Review of Systems     Objective:   Physical Exam chest clear to auscultation; cardiac exam regular rate and rhythm. At Last visit March 13,  weight was 306 pounds and today it is 299 pounds.        Assessment & Plan:  Depression  Anxiety  Fatigue  Morbid obesity  Attention deficit  Plan: Continue Adderall and antidepressants as previously prescribed. Return in 4 months. Encouraged diet and exercise.

## 2011-05-24 ENCOUNTER — Other Ambulatory Visit: Payer: Self-pay | Admitting: Internal Medicine

## 2011-06-28 ENCOUNTER — Other Ambulatory Visit: Payer: Self-pay | Admitting: Internal Medicine

## 2011-07-02 ENCOUNTER — Telehealth: Payer: Self-pay | Admitting: Internal Medicine

## 2011-07-02 NOTE — Telephone Encounter (Signed)
Pt notified that Rx is ready for pick up 

## 2011-07-31 ENCOUNTER — Other Ambulatory Visit: Payer: Self-pay

## 2011-07-31 MED ORDER — AMPHETAMINE-DEXTROAMPHET ER 20 MG PO CP24: 20.0000 mg | ORAL_CAPSULE | ORAL | Status: DC

## 2011-08-27 ENCOUNTER — Other Ambulatory Visit: Payer: Self-pay | Admitting: Internal Medicine

## 2011-09-24 ENCOUNTER — Other Ambulatory Visit: Payer: 59 | Admitting: Internal Medicine

## 2011-09-26 ENCOUNTER — Other Ambulatory Visit: Payer: 59 | Admitting: Internal Medicine

## 2011-09-26 DIAGNOSIS — Z79899 Other long term (current) drug therapy: Secondary | ICD-10-CM

## 2011-09-26 DIAGNOSIS — E119 Type 2 diabetes mellitus without complications: Secondary | ICD-10-CM

## 2011-09-26 DIAGNOSIS — E785 Hyperlipidemia, unspecified: Secondary | ICD-10-CM

## 2011-09-26 LAB — HEPATIC FUNCTION PANEL
ALT: 51 U/L — ABNORMAL HIGH (ref 0–35)
AST: 38 U/L — ABNORMAL HIGH (ref 0–37)
Bilirubin, Direct: 0.1 mg/dL (ref 0.0–0.3)
Indirect Bilirubin: 0.6 mg/dL (ref 0.0–0.9)

## 2011-09-26 LAB — LIPID PANEL: Cholesterol: 217 mg/dL — ABNORMAL HIGH (ref 0–200)

## 2011-09-27 ENCOUNTER — Ambulatory Visit (INDEPENDENT_AMBULATORY_CARE_PROVIDER_SITE_OTHER): Payer: 59 | Admitting: Internal Medicine

## 2011-09-27 ENCOUNTER — Encounter: Payer: Self-pay | Admitting: Internal Medicine

## 2011-09-27 VITALS — BP 122/78 | HR 96 | Temp 97.8°F | Ht 64.25 in | Wt 308.0 lb

## 2011-09-27 DIAGNOSIS — I10 Essential (primary) hypertension: Secondary | ICD-10-CM

## 2011-09-27 DIAGNOSIS — E119 Type 2 diabetes mellitus without complications: Secondary | ICD-10-CM | POA: Insufficient documentation

## 2011-09-27 DIAGNOSIS — F419 Anxiety disorder, unspecified: Secondary | ICD-10-CM

## 2011-09-27 DIAGNOSIS — G4733 Obstructive sleep apnea (adult) (pediatric): Secondary | ICD-10-CM

## 2011-09-27 DIAGNOSIS — E785 Hyperlipidemia, unspecified: Secondary | ICD-10-CM

## 2011-09-27 DIAGNOSIS — K7689 Other specified diseases of liver: Secondary | ICD-10-CM

## 2011-09-27 DIAGNOSIS — E8881 Metabolic syndrome: Secondary | ICD-10-CM | POA: Insufficient documentation

## 2011-09-27 DIAGNOSIS — K760 Fatty (change of) liver, not elsewhere classified: Secondary | ICD-10-CM

## 2011-09-27 DIAGNOSIS — F411 Generalized anxiety disorder: Secondary | ICD-10-CM

## 2011-09-27 DIAGNOSIS — F329 Major depressive disorder, single episode, unspecified: Secondary | ICD-10-CM

## 2011-09-27 DIAGNOSIS — F988 Other specified behavioral and emotional disorders with onset usually occurring in childhood and adolescence: Secondary | ICD-10-CM | POA: Insufficient documentation

## 2011-09-27 NOTE — Patient Instructions (Addendum)
Continue diet exercise and weight loss efforts. Continue same medications. Return in 6 months for physical exam and fasting lab work.

## 2011-09-27 NOTE — Progress Notes (Signed)
  Subjective:    Patient ID: Carolyn Combs, female    DOB: [birth year], 41 y.o.   MRN: [id number]  HPI 41 year old white female with history of morbid obesity, fatty liver disease, depression, anxiety, hypertension, attention deficit disorder, hyperlipidemia, metabolic syndrome, obstructive sleep apnea in today for six-month recheck. Her affect is much brighter than on seen in some time. She says she's going to take a trip in March to [place] to a [employer]. Dealing with marital separation quite well. Says children are doing okay. Work has been more stressful with longer hours recently. Not able to diet and exercise as she should because of heavy work load. Not been eating as well and lipid panel results show that. Remains morbidly obese. Says Adderall XR 20 mg daily is helping her focus and giving her some energy. Remains on Viibryd 40 mg daily. Samples provided. Hemoglobin A1c stable.    Review of Systems     Objective:   Physical Exam neck is supple without JVD, thyromegaly, or carotid bruits; chest clear to auscultation; cardiac exam: regular rate and rhythm normal S1 and S2; extremities without edema. Diabetic foot exam: no evidence of diabetic foot ulcers. Pulses in feet are normal. Skin is warm and dry without rashes. Affect is upbeat and she is smiling today.        Assessment & Plan:  Type 2 diabetes mellitus  Morbid obesity  Abnormal liver enzymes secondary to fatty liver  Hypertension  Attention deficit disorder  Anxiety depression  Obstructive sleep apnea  Hyperlipidemia  Plan: Patient does not want to increase lipid-lowering medication. Still has abnormal liver function test thought to be due to fatty liver infiltration. Encouraged diet and exercise. Hasn't been eating as well as she should do to long hours at work. This is the best I have seen her from a mental standpoint in some time. Her affect is bright she is smiling. Blood pressures under good  control on current regimen. Hemoglobin A1c is stable on diet alone. Am hopeful she will be more motivated in the near future to take better care of herself and get serious about diet exercise and weight loss. She will return in 6 months for fasting lab work and physical examination. I have refilled Adderall XR 20 mg #30 one by mouth daily by written prescription today.

## 2011-10-27 ENCOUNTER — Other Ambulatory Visit: Payer: Self-pay | Admitting: Internal Medicine

## 2011-10-28 ENCOUNTER — Other Ambulatory Visit: Payer: Self-pay

## 2011-10-28 MED ORDER — HYDROCHLOROTHIAZIDE 25 MG PO TABS: 25.0000 mg | ORAL_TABLET | Freq: Every day | ORAL | Status: DC

## 2011-10-28 MED ORDER — SIMVASTATIN 20 MG PO TABS: 20.0000 mg | ORAL_TABLET | Freq: Every day | ORAL | Status: DC

## 2011-11-01 ENCOUNTER — Ambulatory Visit (INDEPENDENT_AMBULATORY_CARE_PROVIDER_SITE_OTHER): Payer: 59 | Admitting: Pulmonary Disease

## 2011-11-01 ENCOUNTER — Encounter: Payer: Self-pay | Admitting: Pulmonary Disease

## 2011-11-01 VITALS — BP 122/78 | HR 89 | Temp 98.0°F | Ht 64.0 in | Wt 320.6 lb

## 2011-11-01 DIAGNOSIS — G4733 Obstructive sleep apnea (adult) (pediatric): Secondary | ICD-10-CM

## 2011-11-01 NOTE — Assessment & Plan Note (Signed)
The patient is wearing CPAP compliantly and feels that it continues to help her sleep and daytime alertness.  However, she is still on the automatic mode, and would rather be on a fixed pressure.  We never received her download from her DME.  We'll have them get this off her current machine, and send to [place] so that we can place the patient on a fixed pressure.  I also encouraged her work aggressively on weight loss.

## 2011-11-01 NOTE — Progress Notes (Signed)
  Subjective:    Patient ID: Carolyn Combs, female    DOB: 28-Aug-1970, 41 y.o.   MRN: [id number]  HPI The patient comes in today for followup of her known obstructive sleep apnea.  She is wearing CPAP compliantly, however is still on the automatic mode.  Her medical equipment company never downloaded her device to switch over to a fixed pressure.  She would rather be on this type of pressure.  She has had no problems with her mask fit, and feels that she is sleeping well.   Review of Systems  Constitutional: Negative for fever and unexpected weight change.  HENT: Negative for ear pain, nosebleeds, congestion, sore throat, rhinorrhea, sneezing, trouble swallowing, dental problem, postnasal drip and sinus pressure.   Eyes: Negative for redness and itching.  Respiratory: Negative for cough, chest tightness, shortness of breath and wheezing.   Cardiovascular: Negative for palpitations and leg swelling.  Gastrointestinal: Negative for nausea and vomiting.  Genitourinary: Negative for dysuria.  Musculoskeletal: Negative for joint swelling.  Skin: Negative for rash.  Neurological: Negative for headaches.  Hematological: Does not bruise/bleed easily.  Psychiatric/Behavioral: Negative for dysphoric mood. The patient is not nervous/anxious.        Objective:   Physical Exam Obese female in no acute distress Nose without purulence or discharge noted No skin breakdown or pressure necrosis from the CPAP mask Lower extremities with mild edema, no cyanosis Alert, does not appear to be sleepy, moves all 4 extremities       Assessment & Plan:

## 2011-11-01 NOTE — Patient Instructions (Addendum)
Will get download off your machine and get you over to a fixed pressure. Work on weight loss, and keep up with mask changes and supplies. followup with me in one year, but call if you haven't heard from me in a few weeks about your download.

## 2011-11-28 ENCOUNTER — Other Ambulatory Visit: Payer: Self-pay | Admitting: Internal Medicine

## 2011-12-15 ENCOUNTER — Other Ambulatory Visit: Payer: Self-pay | Admitting: Pulmonary Disease

## 2011-12-15 DIAGNOSIS — G4733 Obstructive sleep apnea (adult) (pediatric): Secondary | ICD-10-CM

## 2012-01-06 ENCOUNTER — Telehealth: Payer: Self-pay | Admitting: Internal Medicine

## 2012-01-06 MED ORDER — AMPHETAMINE-DEXTROAMPHET ER 20 MG PO CP24: 20.0000 mg | ORAL_CAPSULE | ORAL | Status: DC

## 2012-01-06 NOTE — Telephone Encounter (Signed)
Pt called for prescription for generic Adderall 20mg .

## 2012-01-06 NOTE — Telephone Encounter (Signed)
Rx Adderall 20 mg #30 with no refill written

## 2012-02-11 ENCOUNTER — Telehealth: Payer: Self-pay | Admitting: Internal Medicine

## 2012-02-11 MED ORDER — VILAZODONE HCL 40 MG PO TABS: 40.0000 mg | ORAL_TABLET | Freq: Every day | ORAL | Status: DC

## 2012-02-11 NOTE — Telephone Encounter (Signed)
Rx for Vybrid ssent to pharmacy

## 2012-04-06 ENCOUNTER — Other Ambulatory Visit: Payer: 59 | Admitting: Internal Medicine

## 2012-04-07 ENCOUNTER — Encounter: Payer: 59 | Admitting: Internal Medicine

## 2012-05-05 ENCOUNTER — Other Ambulatory Visit: Payer: 59 | Admitting: Internal Medicine

## 2012-05-05 ENCOUNTER — Other Ambulatory Visit: Payer: Self-pay | Admitting: Internal Medicine

## 2012-05-05 DIAGNOSIS — E119 Type 2 diabetes mellitus without complications: Secondary | ICD-10-CM

## 2012-05-05 DIAGNOSIS — E785 Hyperlipidemia, unspecified: Secondary | ICD-10-CM

## 2012-05-05 DIAGNOSIS — I10 Essential (primary) hypertension: Secondary | ICD-10-CM

## 2012-05-05 LAB — TSH: TSH: 2.553 u[IU]/mL (ref 0.350–4.500)

## 2012-05-05 LAB — LIPID PANEL
Cholesterol: 221 mg/dL — ABNORMAL HIGH (ref 0–200)
HDL: 54 mg/dL (ref >39)
LDL Cholesterol: 148 mg/dL — ABNORMAL HIGH (ref 0–99)
Triglycerides: 94 mg/dL (ref <150)

## 2012-05-05 LAB — COMPREHENSIVE METABOLIC PANEL
Albumin: 4.4 g/dL (ref 3.5–5.2)
Alkaline Phosphatase: 106 U/L (ref 39–117)
BUN: 19 mg/dL (ref 6–23)
Glucose, Bld: 95 mg/dL (ref 70–99)
Potassium: 4.2 mEq/L (ref 3.5–5.3)

## 2012-05-05 LAB — CBC WITH DIFFERENTIAL/PLATELET
Basophils Relative: 1 % (ref 0–1)
Eosinophils Absolute: 0.2 10*3/uL (ref 0.0–0.7)
HCT: 38.5 % (ref 36.0–46.0)
Hemoglobin: 12.6 g/dL (ref 12.0–15.0)
MCH: 27.1 pg (ref 26.0–34.0)
MCHC: 32.7 g/dL (ref 30.0–36.0)
Monocytes Absolute: 0.4 10*3/uL (ref 0.1–1.0)
Monocytes Relative: 8 % (ref 3–12)

## 2012-05-07 ENCOUNTER — Ambulatory Visit (INDEPENDENT_AMBULATORY_CARE_PROVIDER_SITE_OTHER): Payer: 59 | Admitting: Internal Medicine

## 2012-05-07 ENCOUNTER — Encounter: Payer: Self-pay | Admitting: Internal Medicine

## 2012-05-07 VITALS — BP 126/90 | HR 84 | Temp 98.0°F | Ht 65.5 in | Wt 311.0 lb

## 2012-05-07 DIAGNOSIS — F419 Anxiety disorder, unspecified: Secondary | ICD-10-CM

## 2012-05-07 DIAGNOSIS — E559 Vitamin D deficiency, unspecified: Secondary | ICD-10-CM

## 2012-05-07 DIAGNOSIS — E785 Hyperlipidemia, unspecified: Secondary | ICD-10-CM

## 2012-05-07 DIAGNOSIS — Z0000 Encounter for general adult medical examination without abnormal findings: Secondary | ICD-10-CM

## 2012-05-07 DIAGNOSIS — K760 Fatty (change of) liver, not elsewhere classified: Secondary | ICD-10-CM

## 2012-05-07 DIAGNOSIS — R7302 Impaired glucose tolerance (oral): Secondary | ICD-10-CM

## 2012-05-07 DIAGNOSIS — F341 Dysthymic disorder: Secondary | ICD-10-CM

## 2012-05-07 DIAGNOSIS — K7689 Other specified diseases of liver: Secondary | ICD-10-CM

## 2012-05-07 DIAGNOSIS — E8881 Metabolic syndrome: Secondary | ICD-10-CM

## 2012-05-07 DIAGNOSIS — R7309 Other abnormal glucose: Secondary | ICD-10-CM

## 2012-05-07 DIAGNOSIS — K219 Gastro-esophageal reflux disease without esophagitis: Secondary | ICD-10-CM

## 2012-05-07 DIAGNOSIS — I10 Essential (primary) hypertension: Secondary | ICD-10-CM

## 2012-05-07 LAB — POCT URINALYSIS DIPSTICK
Bilirubin, UA: NEGATIVE
Blood, UA: NEGATIVE
Glucose, UA: NEGATIVE
Ketones, UA: NEGATIVE
Leukocytes, UA: NEGATIVE
Nitrite, UA: NEGATIVE
pH, UA: 6.5

## 2012-06-02 ENCOUNTER — Other Ambulatory Visit: Payer: Self-pay | Admitting: Internal Medicine

## 2012-07-02 ENCOUNTER — Telehealth: Payer: Self-pay

## 2012-07-02 NOTE — Telephone Encounter (Signed)
LVM, advising that [facility] faxed a request to Dr. Lenord Fellers wanting a lower cost medication other that Vibryd. Per Dr. Lenord Fellers, a lot of meds have been tried and failed, and she wants her to stay on the Vibryd. I advised that she can go to [email or web address] to obtain a coupon. KW

## 2012-07-03 ENCOUNTER — Other Ambulatory Visit: Payer: Self-pay

## 2012-07-03 DIAGNOSIS — I10 Essential (primary) hypertension: Secondary | ICD-10-CM

## 2012-07-03 DIAGNOSIS — R1013 Epigastric pain: Secondary | ICD-10-CM

## 2012-07-03 DIAGNOSIS — F329 Major depressive disorder, single episode, unspecified: Secondary | ICD-10-CM

## 2012-07-03 DIAGNOSIS — E785 Hyperlipidemia, unspecified: Secondary | ICD-10-CM

## 2012-07-03 MED ORDER — RAMIPRIL 10 MG PO CAPS: 10.0000 mg | ORAL_CAPSULE | Freq: Every day | ORAL | Status: DC

## 2012-07-03 MED ORDER — HYDROCHLOROTHIAZIDE 25 MG PO TABS: 25.0000 mg | ORAL_TABLET | Freq: Every day | ORAL | Status: DC

## 2012-07-03 MED ORDER — VILAZODONE HCL 40 MG PO TABS: 40.0000 mg | ORAL_TABLET | Freq: Every day | ORAL | Status: DC

## 2012-07-03 MED ORDER — SIMVASTATIN 20 MG PO TABS: 20.0000 mg | ORAL_TABLET | Freq: Every day | ORAL | Status: DC

## 2012-07-03 MED ORDER — OMEPRAZOLE 20 MG PO CPDR: 20.0000 mg | DELAYED_RELEASE_CAPSULE | Freq: Every day | ORAL | Status: DC

## 2012-08-29 IMAGING — US US ABDOMEN LIMITED
1 series · 14 of 25 positions shown · non-contrast
Comparison: CT abdomen pelvis of 03/28/2009

CLINICAL DATA: Elevated liver function tests

LIMITED ABDOMINAL ULTRASOUND - RIGHT UPPER QUADRANT

[Series 1: us abdomen limited · 0.31mm/px · 14 of 36 slices shown]
[im 1/36]
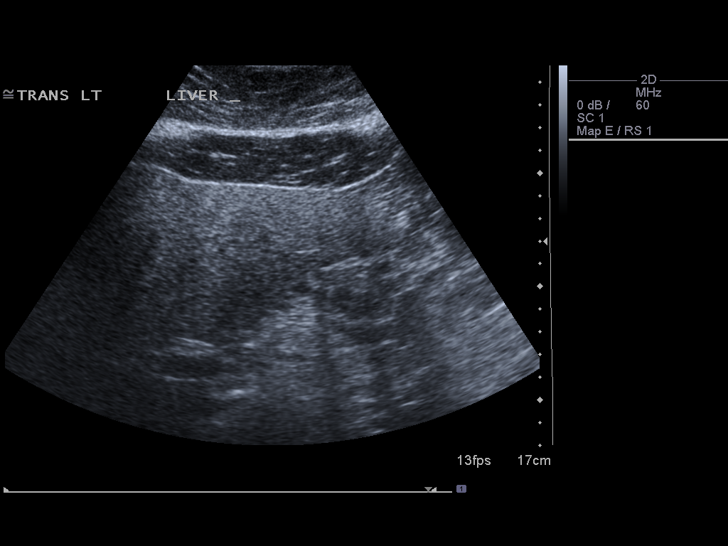
[im 3/36]
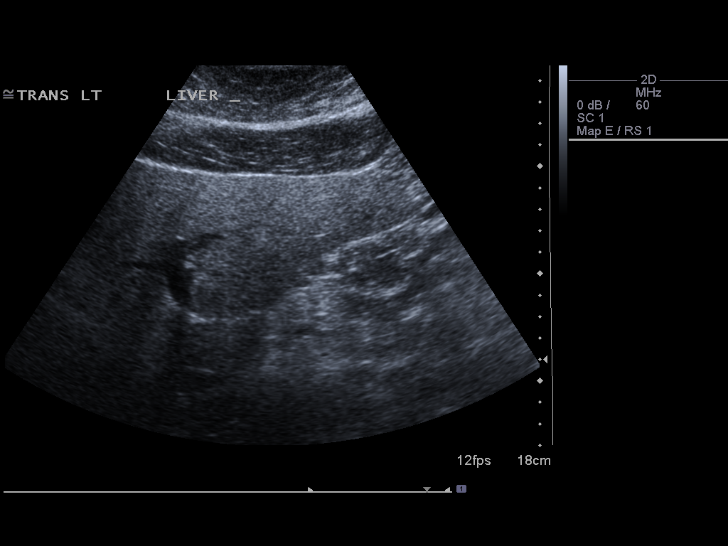
[im 6/36]
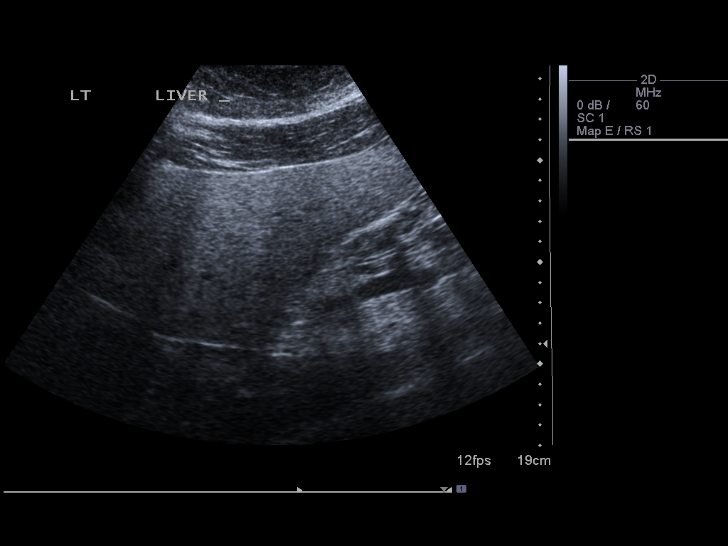
[im 9/36]
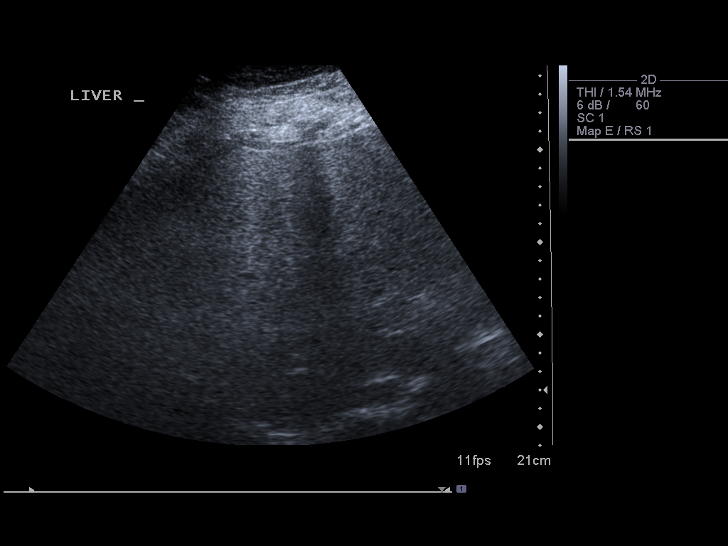
[im 12/36]
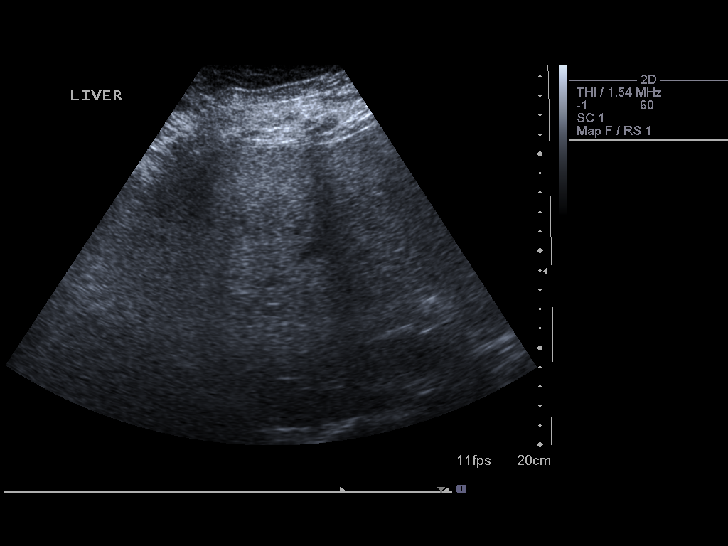
[im 14/36]
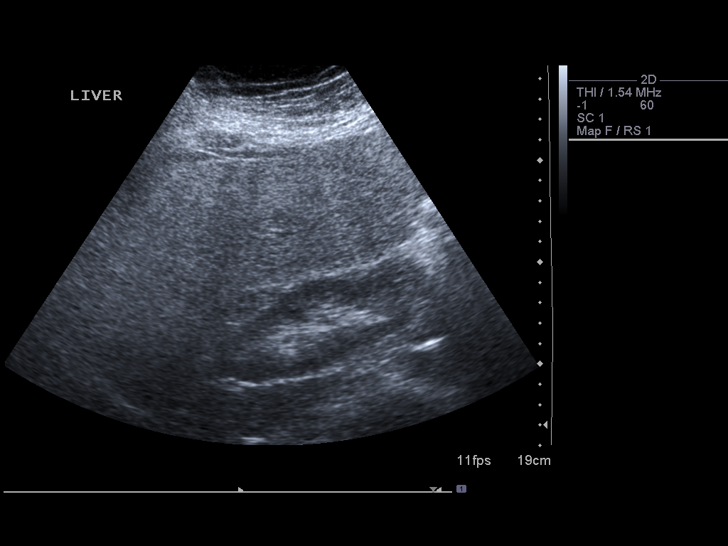
[im 17/36]
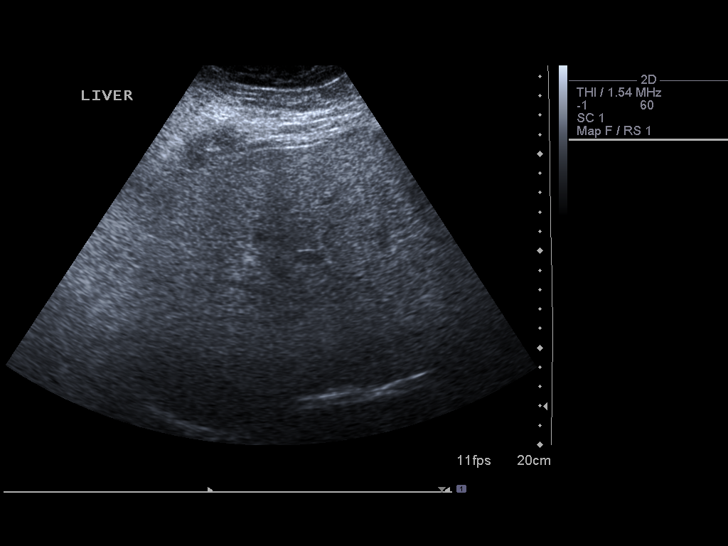
[im 19/36]
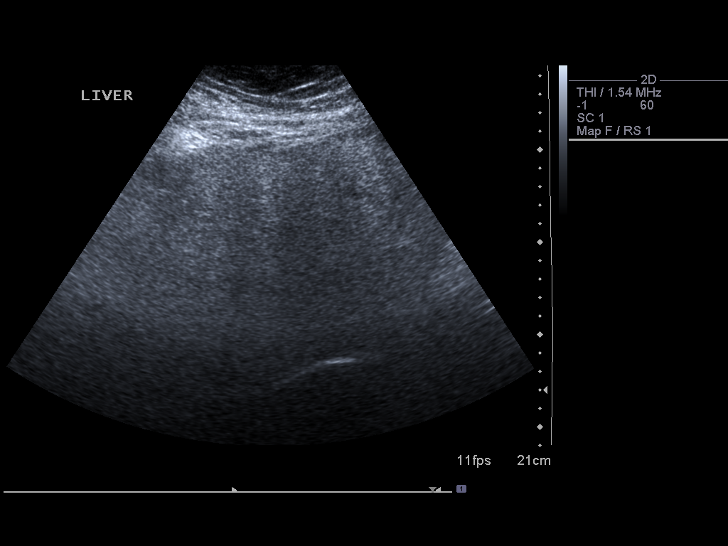
[im 22/36]
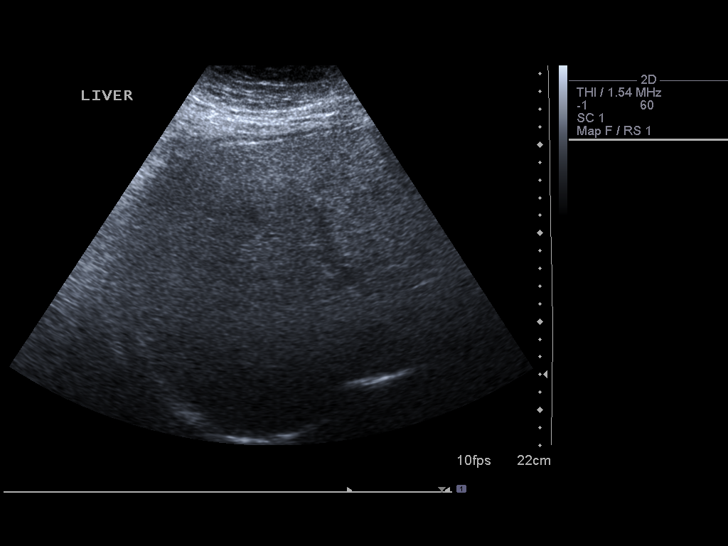
[im 24/36]
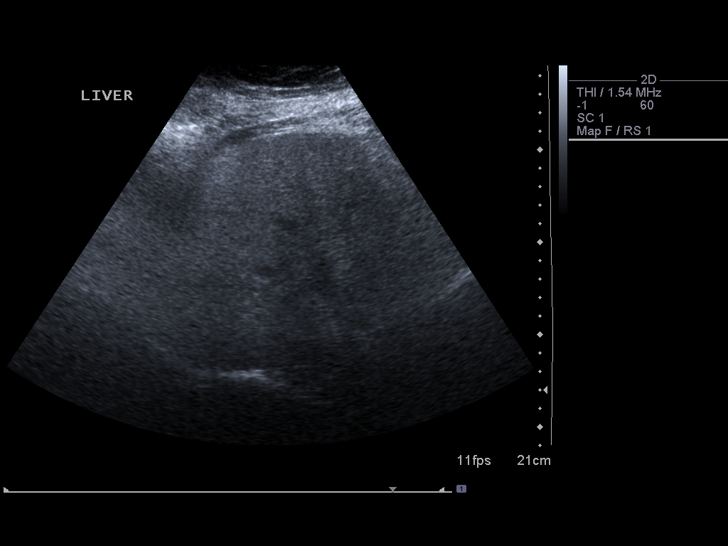
[im 27/36]
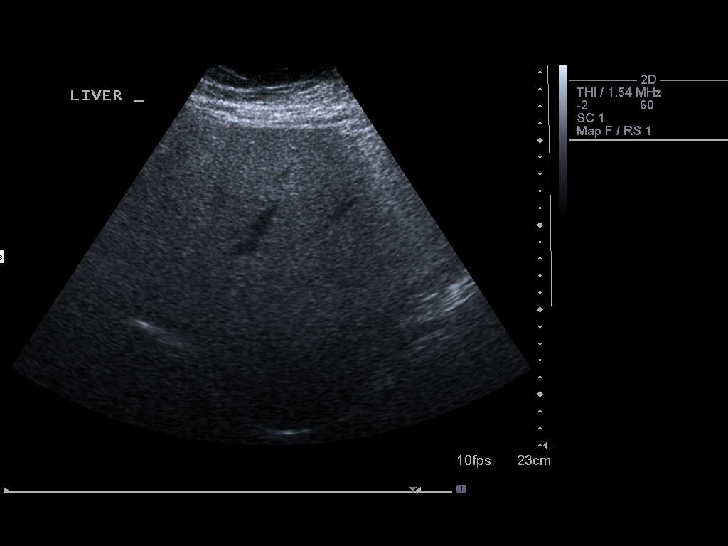
[im 30/36]
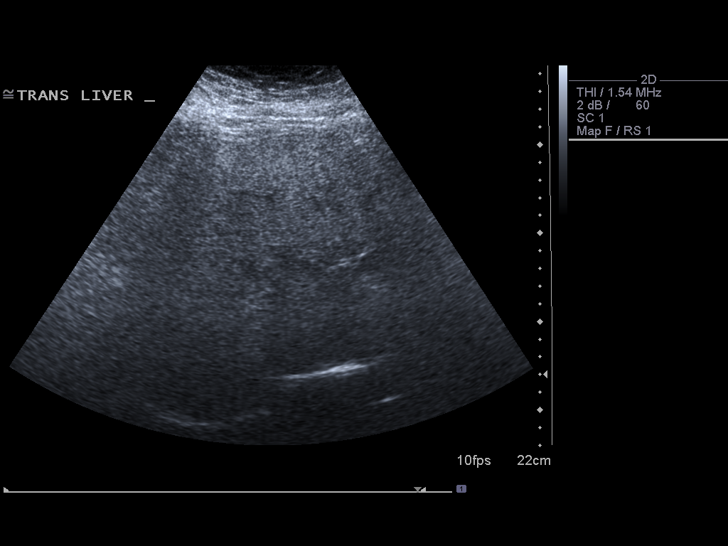
[im 33/36]
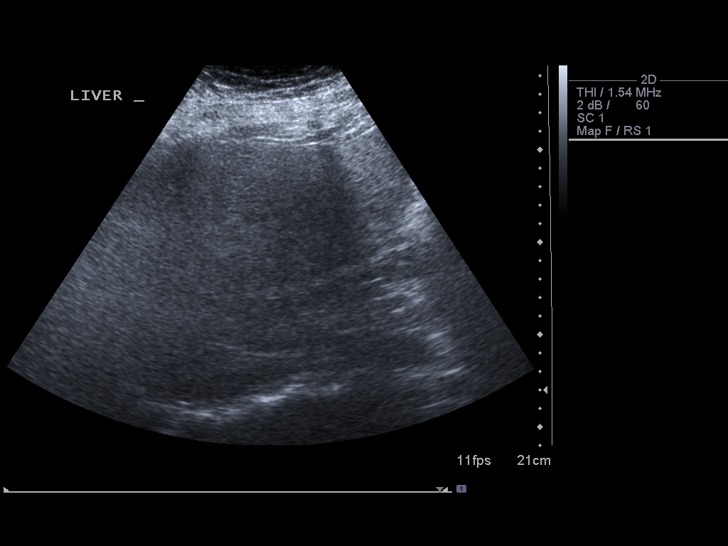
[im 36/36]
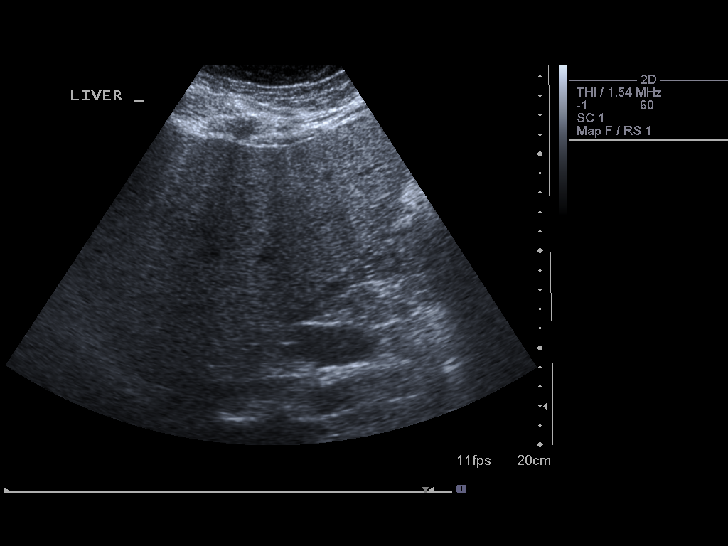

[14 of 25 positions shown; findings below may reference images not displayed]

FINDINGS: Gallbladder:  The gallbladder has previously been resected.

Common bile duct:  The common bile duct is normal measuring 3.9 mm
in diameter.

Liver:  The liver is echogenic consistent with diffuse fatty
infiltration.  No focal abnormality is seen.  No ductal dilatation
is noted.

This study is somewhat compromised by large patient body habitus.
IMPRESSION: 1.  Fatty infiltration of the liver.  Prior cholecystectomy.
2.  Somewhat compromised study due to large patient body habitus.

## 2012-10-21 NOTE — Patient Instructions (Addendum)
Continue same meds and return in 6 months 

## 2012-10-21 NOTE — Progress Notes (Signed)
Subjective:    Patient ID: Carolyn Combs, female    DOB: August 08, 1970, 42 y.o.   MRN: [id number]  HPI 42 year old white female with history of morbid obesity, GE reflux, hyperlipidemia, hypertension, hypokalemia secondary to diuretic therapy, impaired glucose tolerance, anxiety, depression, constipation in today for health maintenance and evaluation of medical problems. History of mild elevation of SGOT and SGPT due to fatty liver infiltration.  Anxiety/ depression took a toll on her in 2013. She had some difficulty concentrating at work. I placed her on attention deficit medication and Pristiq. She had side effects with Pristiq and was changed to Viibryd.  Past medical history: Patient was hospitalized with [place] fever 1977. She was hospitalized twice for childbirth in 2003 and 2004. Fractured left orbit do to a softball accident in 1992 which required surgery. Wisdom teeth extraction. History of vaginal sling procedure for incontinence.  Social history: Patient works for [employer]. She is in the process of a divorce. Husband wanted a divorce. He is a paramedic. They have 2 children a boy and girl.  Family history: Father good health. Mother age 7 in poor health with history of breast cancer COPD and hypertension. Paternal grandmother with history of anxiety. One sister in good health.    Review of Systems remarkable for fatigue, GE reflux, anxiety depression and insomnia     Objective:   Physical Exam  Vitals reviewed. Constitutional: She is oriented to person, place, and time. She appears well-developed and well-nourished. No distress.  HENT:  Head: Normocephalic and atraumatic.  Right Ear: External ear normal.  Left Ear: External ear normal.  Mouth/Throat: Oropharynx is clear and moist. No oropharyngeal exudate.  Eyes: Conjunctivae and EOM are normal. Pupils are equal, round, and reactive to light. Right eye exhibits no discharge. Left eye exhibits no discharge. No  scleral icterus.  Neck: Neck supple. No JVD present. No thyromegaly present.  Cardiovascular: Normal rate, regular rhythm, normal heart sounds and intact distal pulses.   No murmur heard. Pulmonary/Chest: Effort normal and breath sounds normal. No respiratory distress. She has no wheezes. She has no rales. She exhibits no tenderness.  Abdominal: Soft. Bowel sounds are normal. She exhibits no distension and no mass. There is no tenderness. There is no rebound and no guarding.  Genitourinary:  Deferred  Musculoskeletal: Normal range of motion. She exhibits no edema.  Lymphadenopathy:    She has no cervical adenopathy.  Neurological: She is alert and oriented to person, place, and time. She has normal reflexes. No cranial nerve deficit. Coordination normal.  Skin: Skin is warm and dry. No rash noted. She is not diaphoretic.  Psychiatric: She has a normal mood and affect. Her behavior is normal. Judgment and thought content normal.          Assessment & Plan:  Morbid obesity  GERD  Hypertension  Hyperlipidemia  Metabolic syndrome  Impaired glucose tolerance  Fatty liver disease  Anxiety depression  Vitamin D deficiency  Plan: Patient seems much better with regard to anxiety depression than she did last year. She has moved closer to her family. Children will be staying with her during the week and attending school near [place]. Sometimes she can work from home. Motivation has been an issue for her even prior to divorce proceedings. Feels she can concentrate at work much better now. She still has abnormal lipids. Fatty liver seems to be improved with improvement in liver functions. Glucose intolerance seems to be improved with better hemoglobin A1c. Blood pressure stable  on current regimen. Anxiety depression is stable. Recommend 2000 units vitamin D 3 daily for vitamin D deficiency. Asked patient to consider gastric bypass surgery for morbid obesity. Return in 6 months.

## 2012-11-02 ENCOUNTER — Ambulatory Visit: Payer: 59 | Admitting: Pulmonary Disease

## 2012-11-04 ENCOUNTER — Encounter: Payer: Self-pay | Admitting: Pulmonary Disease

## 2012-11-05 ENCOUNTER — Other Ambulatory Visit: Payer: 59 | Admitting: Internal Medicine

## 2012-11-06 ENCOUNTER — Ambulatory Visit: Payer: 59 | Admitting: Internal Medicine

## 2012-11-23 ENCOUNTER — Ambulatory Visit
Admission: RE | Admit: 2012-11-23 | Discharge: 2012-11-23 | Disposition: A | Discharge status: Home / Self Care | Payer: 59 | Source: Ambulatory Visit | Attending: Internal Medicine | Admitting: Internal Medicine

## 2012-11-23 ENCOUNTER — Ambulatory Visit (INDEPENDENT_AMBULATORY_CARE_PROVIDER_SITE_OTHER): Payer: 59 | Admitting: Internal Medicine

## 2012-11-23 ENCOUNTER — Other Ambulatory Visit: Payer: 59 | Admitting: Internal Medicine

## 2012-11-23 ENCOUNTER — Encounter: Payer: Self-pay | Admitting: Internal Medicine

## 2012-11-23 VITALS — BP 128/80 | HR 88 | Temp 97.6°F | Ht 65.0 in | Wt 298.0 lb

## 2012-11-23 DIAGNOSIS — E119 Type 2 diabetes mellitus without complications: Secondary | ICD-10-CM

## 2012-11-23 DIAGNOSIS — K760 Fatty (change of) liver, not elsewhere classified: Secondary | ICD-10-CM

## 2012-11-23 DIAGNOSIS — I10 Essential (primary) hypertension: Secondary | ICD-10-CM

## 2012-11-23 DIAGNOSIS — M79609 Pain in unspecified limb: Secondary | ICD-10-CM

## 2012-11-23 DIAGNOSIS — M79641 Pain in right hand: Secondary | ICD-10-CM

## 2012-11-23 DIAGNOSIS — E785 Hyperlipidemia, unspecified: Secondary | ICD-10-CM

## 2012-11-23 DIAGNOSIS — Z23 Encounter for immunization: Secondary | ICD-10-CM

## 2012-11-23 LAB — LIPID PANEL
Cholesterol: 210 mg/dL — ABNORMAL HIGH (ref 0–200)
HDL: 46 mg/dL (ref >39)
Total CHOL/HDL Ratio: 4.6 Ratio
VLDL: 27 mg/dL (ref 0–40)

## 2012-11-23 LAB — HEPATIC FUNCTION PANEL
ALT: 48 U/L — ABNORMAL HIGH (ref 0–35)
AST: 37 U/L (ref 0–37)
Albumin: 4.3 g/dL (ref 3.5–5.2)

## 2012-11-23 LAB — HEMOGLOBIN A1C
Hgb A1c MFr Bld: 6.9 % — ABNORMAL HIGH (ref <5.7)
Mean Plasma Glucose: 151 mg/dL — ABNORMAL HIGH (ref <117)

## 2012-11-24 ENCOUNTER — Encounter: Payer: Self-pay | Admitting: Internal Medicine

## 2012-11-24 LAB — MICROALBUMIN / CREATININE URINE RATIO
Creatinine, Urine: 29.7 mg/dL
Microalb Creat Ratio: 16.8 mg/g (ref 0.0–30.0)

## 2012-11-24 MED ORDER — SIMVASTATIN 40 MG PO TABS: 40.0000 mg | ORAL_TABLET | Freq: Every day | ORAL | Status: DC

## 2012-11-24 MED ORDER — METFORMIN HCL 500 MG PO TABS: 500.0000 mg | ORAL_TABLET | Freq: Two times a day (BID) | ORAL | Status: DC

## 2012-11-24 NOTE — Patient Instructions (Signed)
Increase Zocor to 40 mg daily. Start metformin 500 mg twice daily. Watch Accu-Cheks at least once daily. Return in 6 months. Diet and exercise.

## 2012-11-24 NOTE — Progress Notes (Signed)
  Subjective:    Patient ID: Carolyn Combs, female    DOB: 12-02-1970, 42 y.o.   MRN: [id number]  HPI  42 year old female in today for evaluation of medical issues including hypertension, hyperlipidemia, type 2 diabetes mellitus. She is overweight. She is now living in [facility] with her father. Her children are living with her and attending school there. Things seem to be going well. She's found a good church. Working from home.   Several months ago she fell and injured her right index finger MTP joint. We are sending her for x-ray today. It hurts at times weekly with cold weather. She doesn't check Accu-Cheks on a regular basis. Despite being on Zocor LDL cholesterol is 137 and total cholesterol was 210. Has slight elevation of SGPT of  48 which is consistent with fatty liver. Hemoglobin A1c 6.9% with mean plasma glucose 151. Liver functions are normal with the exception of the mild elevation of SGPT.    Review of Systems     Objective:   Physical Exam Neck is supple without thyromegaly. Chest clear. Cardiac exam regular rate and rhythm. Extremities without edema. Diabetic foot exam is negative.         Assessment & Plan:  Hypertension-stable  Hyperlipidemia-controlled to be better. Increase Zocor to 40 mg daily and recheck in 6 months.  Type 2 diabetes mellitus-control could be better. 6 months ago A1c was 5.7% and is now 6.9%. Start metformin 500 mg twice daily and reevaluate in 6 months.  Morbid obesity-encouraged diet and exercise  Anxiety depression-stable  Plan: Physical exam in 6 months

## 2013-05-25 ENCOUNTER — Encounter: Payer: Self-pay | Admitting: Internal Medicine

## 2013-05-25 ENCOUNTER — Other Ambulatory Visit: Payer: 59 | Admitting: Internal Medicine

## 2013-05-25 ENCOUNTER — Ambulatory Visit (INDEPENDENT_AMBULATORY_CARE_PROVIDER_SITE_OTHER): Payer: 59 | Admitting: Internal Medicine

## 2013-05-25 VITALS — BP 130/84 | HR 100 | Ht 65.5 in | Wt 315.0 lb

## 2013-05-25 DIAGNOSIS — F411 Generalized anxiety disorder: Secondary | ICD-10-CM

## 2013-05-25 DIAGNOSIS — F32A Depression, unspecified: Secondary | ICD-10-CM

## 2013-05-25 DIAGNOSIS — E8881 Metabolic syndrome: Secondary | ICD-10-CM

## 2013-05-25 DIAGNOSIS — E119 Type 2 diabetes mellitus without complications: Secondary | ICD-10-CM

## 2013-05-25 DIAGNOSIS — G4733 Obstructive sleep apnea (adult) (pediatric): Secondary | ICD-10-CM

## 2013-05-25 DIAGNOSIS — Z0000 Encounter for general adult medical examination without abnormal findings: Secondary | ICD-10-CM

## 2013-05-25 DIAGNOSIS — Z1329 Encounter for screening for other suspected endocrine disorder: Secondary | ICD-10-CM

## 2013-05-25 DIAGNOSIS — I10 Essential (primary) hypertension: Secondary | ICD-10-CM

## 2013-05-25 DIAGNOSIS — K219 Gastro-esophageal reflux disease without esophagitis: Secondary | ICD-10-CM

## 2013-05-25 DIAGNOSIS — E785 Hyperlipidemia, unspecified: Secondary | ICD-10-CM

## 2013-05-25 DIAGNOSIS — F329 Major depressive disorder, single episode, unspecified: Secondary | ICD-10-CM

## 2013-05-25 DIAGNOSIS — Z79899 Other long term (current) drug therapy: Secondary | ICD-10-CM

## 2013-05-25 DIAGNOSIS — Z130 Encounter for screening for diseases of the blood and blood-forming organs and certain disorders involving the immune mechanism: Secondary | ICD-10-CM

## 2013-05-25 DIAGNOSIS — F3289 Other specified depressive episodes: Secondary | ICD-10-CM

## 2013-05-25 DIAGNOSIS — Z13228 Encounter for screening for other metabolic disorders: Secondary | ICD-10-CM

## 2013-05-25 LAB — COMPREHENSIVE METABOLIC PANEL
ALT: 72 U/L — ABNORMAL HIGH (ref 0–35)
AST: 86 U/L — ABNORMAL HIGH (ref 0–37)
Albumin: 4.2 g/dL (ref 3.5–5.2)
Alkaline Phosphatase: 112 U/L (ref 39–117)
BILIRUBIN TOTAL: 0.9 mg/dL (ref 0.2–1.2)
BUN: 14 mg/dL (ref 6–23)
CO2: 27 meq/L (ref 19–32)
CREATININE: 0.76 mg/dL (ref 0.50–1.10)
Calcium: 9.9 mg/dL (ref 8.4–10.5)
Chloride: 97 mEq/L (ref 96–112)
Glucose, Bld: 217 mg/dL — ABNORMAL HIGH (ref 70–99)
Potassium: 3.9 mEq/L (ref 3.5–5.3)
Sodium: 136 mEq/L (ref 135–145)
Total Protein: 7.1 g/dL (ref 6.0–8.3)

## 2013-05-25 LAB — CBC WITH DIFFERENTIAL/PLATELET
Basophils Absolute: 0 10*3/uL (ref 0.0–0.1)
Basophils Relative: 0 % (ref 0–1)
EOS ABS: 0.2 10*3/uL (ref 0.0–0.7)
EOS PCT: 3 % (ref 0–5)
HCT: 40.7 % (ref 36.0–46.0)
Hemoglobin: 13.2 g/dL (ref 12.0–15.0)
Lymphocytes Relative: 34 % (ref 12–46)
Lymphs Abs: 1.7 10*3/uL (ref 0.7–4.0)
MCH: 27.2 pg (ref 26.0–34.0)
MCHC: 32.4 g/dL (ref 30.0–36.0)
MCV: 83.9 fL (ref 78.0–100.0)
Monocytes Absolute: 0.3 10*3/uL (ref 0.1–1.0)
Monocytes Relative: 5 % (ref 3–12)
Neutro Abs: 2.9 10*3/uL (ref 1.7–7.7)
Neutrophils Relative %: 58 % (ref 43–77)
PLATELETS: 204 10*3/uL (ref 150–400)
RBC: 4.85 MIL/uL (ref 3.87–5.11)
RDW: 14.2 % (ref 11.5–15.5)
WBC: 5 10*3/uL (ref 4.0–10.5)

## 2013-05-25 LAB — LIPID PANEL
CHOL/HDL RATIO: 4.5 ratio
CHOLESTEROL: 199 mg/dL (ref 0–200)
HDL: 44 mg/dL (ref >39)
LDL Cholesterol: 126 mg/dL — ABNORMAL HIGH (ref 0–99)
Triglycerides: 143 mg/dL (ref <150)
VLDL: 29 mg/dL (ref 0–40)

## 2013-05-25 LAB — POCT URINALYSIS DIPSTICK
Bilirubin, UA: NEGATIVE
KETONES UA: NEGATIVE
LEUKOCYTES UA: NEGATIVE
NITRITE UA: NEGATIVE
Protein, UA: NEGATIVE
Spec Grav, UA: 1.01
Urobilinogen, UA: NEGATIVE
pH, UA: 6

## 2013-05-25 LAB — TSH: TSH: 6.291 u[IU]/mL — ABNORMAL HIGH (ref 0.350–4.500)

## 2013-05-25 LAB — HEMOGLOBIN A1C
Hgb A1c MFr Bld: 9.2 % — ABNORMAL HIGH (ref <5.7)
Mean Plasma Glucose: 217 mg/dL — ABNORMAL HIGH (ref <117)

## 2013-05-25 MED ORDER — CLONAZEPAM 0.5 MG PO TABS: 0.5000 mg | ORAL_TABLET | Freq: Two times a day (BID) | ORAL | Status: DC | PRN

## 2013-05-25 MED ORDER — BUPROPION HCL ER (XL) 300 MG PO TB24: 300.0000 mg | ORAL_TABLET | Freq: Every day | ORAL | Status: DC

## 2013-05-25 NOTE — Progress Notes (Signed)
Subjective:    Patient ID: [name], female    DOB: 1970/02/06, 43 y.o.   MRN: [id number]  HPI 43 year old White Female in today for health maintenance exam and evaluation of medical issues. She has a history of hypertension, hyperlipidemia, metabolic syndrome, morbid obesity, type 2 diabetes mellitus, anxiety depression, elevated liver functions due to fatty liver, constipation, GE reflux, obstructive sleep apnea.  Patient is tearful in the office today saying that she use not able to get herself organized. She has 2 children who live at home with her most of the time. Their father lives in [place] and gets them every other weekend. She lives with her father in [place]. She works for [employer] and works from home. She spends 8 hours a day at work on the computer. Doesn't get much exercise. Has a lot of household items she got when her marriage ended that she needs to sort out. Can't seem to get motivated to do that. Children are a handful. Son has attention deficit hyperactivity disorder and some emotional issues. She says he picks fights with his sister. She doesn't feel she can keep control over the children. Her sister lives next door and is supportive. Children are involved in sports right now and she has to attend a lot of ball games. Asking if some other medication can be added to her regimen or change given. She is on Vibryd and initially it worked very well but she doesn't feel that it's working well nail. Suggested to her that it could be a situational problem rather than a medication problem.  In 2013 she had difficulty concentrating at work. I placed her on attention deficit medication and Pristiq. She had side effects with Pristiq and was changed to Vibryd. She did well with this regimen for while but then fell she didn't need the attention deficit medication any longer. Says she has trouble sitting still. Says she really doesn't like her work right now. Feels that  job has become very demanding. Considering looking for another job. She has been with this company for a good while.  Past medical history: Patient was hospitalized with [facility] spotted fever in 1977. Was hospitalized twice for childbirth in 2003 in 2004. Fractured left orbit do to a softball accident in 1992 which required surgery. Wisdom teeth extraction. History of vaginal sling procedure for incontinence.   Social history: In the process of a divorce. Husband wanted divorce. He felt that they had grown apart and she  was quite depressed. He is a paramedic. 2 children.  Family history: Father in good health. Mother had history of breast cancer COPD and hypertension. Paternal grandmother with history of anxiety. Sister in good health.    Review of Systems  Constitutional: Positive for fatigue.  HENT: Negative.   Eyes: Negative.   Respiratory: Negative.   Cardiovascular: Negative.   Gastrointestinal: Negative.   Endocrine: Negative.   Genitourinary: Negative.   Allergic/Immunologic: Negative.   Hematological: Negative.   Psychiatric/Behavioral:       Anxiety depression and lack of organizational skills  All other systems reviewed and are negative.      Objective:   Physical Exam  Vitals reviewed. Constitutional: She is oriented to person, place, and time. She appears well-developed and well-nourished. No distress.  HENT:  Head: Normocephalic and atraumatic.  Right Ear: External ear normal.  Left Ear: External ear normal.  Mouth/Throat: No oropharyngeal exudate.  Eyes: Conjunctivae are normal. Pupils are equal, round, and reactive to  light. Right eye exhibits no discharge. Left eye exhibits no discharge.  Neck: Neck supple. No JVD present. No thyromegaly present.  Cardiovascular: Normal rate, regular rhythm, normal heart sounds and intact distal pulses.   No murmur heard. Pulmonary/Chest: Effort normal and breath sounds normal. No respiratory distress. She has no  wheezes. She has no rales.  Breasts normal female without masses  Abdominal: Soft. Bowel sounds are normal. She exhibits no distension and no mass. There is no tenderness. There is no rebound and no guarding.  Genitourinary:  Deferred to GYN  Musculoskeletal: Normal range of motion. She exhibits no edema.  Neurological: She is alert and oriented to person, place, and time. She has normal reflexes. She displays normal reflexes. No cranial nerve deficit. Coordination normal.  Skin: Skin is warm and dry. No rash noted. She is not diaphoretic.  Psychiatric: She has a normal mood and affect. Her behavior is normal. Judgment and thought content normal.          Assessment & Plan:  Morbid obesity  Hypertension  Type 2 diabetes mellitus  Hyperlipidemia  Metabolic syndrome  Anxiety depression  History of GE reflux  Of obstructive sleep apnea  Plan: I have asked patient will or not she's consider gastric bypass surgery and she has some doubts about it. I want her to consider it. It would help all of her medical problems and she would have better self-esteem and more energy. We're going to add Wellbutrin XL 300 mg daily to Vibryd. She'll return in 4 weeks for followup. I'm going to prescribe Klonopin 0.5 mg for anxiety and she may take that up to twice daily. I spent more than 20 minutes speaking with her about her concerns, her family situation, lack of motivation and organizational skills. I have strongly suggested that she consider counseling. She says she might be able to do that through someone in [facility] and that her sister may be able to help her find someone in his sister is working at [facility].

## 2013-05-25 NOTE — Patient Instructions (Signed)
Add Wellbutrin to Vibryd and return in 4 weeks. Take Klonopin twice daily as needed for anxiety. Consider counseling. Consider gastric bypass surgery.

## 2013-05-26 ENCOUNTER — Telehealth: Payer: Self-pay | Admitting: Internal Medicine

## 2013-05-26 ENCOUNTER — Ambulatory Visit (INDEPENDENT_AMBULATORY_CARE_PROVIDER_SITE_OTHER): Payer: 59 | Admitting: Pulmonary Disease

## 2013-05-26 ENCOUNTER — Encounter: Payer: Self-pay | Admitting: Pulmonary Disease

## 2013-05-26 VITALS — BP 124/86 | HR 97 | Temp 98.2°F | Ht 65.0 in | Wt 358.6 lb

## 2013-05-26 DIAGNOSIS — G4733 Obstructive sleep apnea (adult) (pediatric): Secondary | ICD-10-CM

## 2013-05-26 LAB — MICROALBUMIN, URINE: Microalb, Ur: 1.08 mg/dL (ref 0.00–1.89)

## 2013-05-26 LAB — VITAMIN D 25 HYDROXY (VIT D DEFICIENCY, FRACTURES): Vit D, 25-Hydroxy: 28 ng/mL — ABNORMAL LOW (ref 30–89)

## 2013-05-26 MED ORDER — SITAGLIPTIN PHOSPHATE 100 MG PO TABS: 100.0000 mg | ORAL_TABLET | Freq: Every day | ORAL | Status: DC

## 2013-05-26 MED ORDER — LEVOTHYROXINE SODIUM 100 MCG PO TABS: ORAL_TABLET | ORAL | Status: DC

## 2013-05-26 NOTE — Patient Instructions (Signed)
Continue with cpap, and keep up with mask changes and supplies. Work on weight loss followup with me in one year if doing well.  

## 2013-05-26 NOTE — Assessment & Plan Note (Signed)
The patient is doing very well with CPAP on her current setting, and is satisfied with her sleep and daytime alertness. She is way overdue for a new mask and supplies, and we'll send the order to her home care company. I've also encouraged her work aggressively on weight loss, since she has gained significant weight since the last visit.

## 2013-05-26 NOTE — Progress Notes (Signed)
   Subjective:    Patient ID: [name], female    DOB: 1970-03-05, 43 y.o.   MRN: [id number]  HPI The patient comes in today for followup of her obstructive sleep apnea. She has not been seen and one half years, but tells me that she is wearing her CPAP compliantly. She is satisfied with her sleep and daytime alertness, but has not kept up with her mask changes and supplies. It should be noted that she has gained 28 pounds since last visit.   Review of Systems  Constitutional: Negative for fever and unexpected weight change.  HENT: Negative for congestion, dental problem, ear pain, nosebleeds, postnasal drip, rhinorrhea, sinus pressure, sneezing, sore throat and trouble swallowing.   Eyes: Negative for redness and itching.  Respiratory: Negative for cough, chest tightness, shortness of breath and wheezing.   Cardiovascular: Negative for palpitations and leg swelling.  Gastrointestinal: Negative for nausea and vomiting.  Genitourinary: Negative for dysuria.  Musculoskeletal: Negative for joint swelling.  Skin: Negative for rash.  Neurological: Negative for headaches.  Hematological: Does not bruise/bleed easily.  Psychiatric/Behavioral: Negative for dysphoric mood. The patient is not nervous/anxious.        Objective:   Physical Exam Morbidly obese female in no acute distress Nose without purulence or discharge noted Neck without lymphadenopathy or thyromegaly No skin breakdown or pressure necrosis from the CPAP mask Lower extremities with edema noted, no cyanosis Alert and oriented, moves all 4 extremities.       Assessment & Plan:

## 2013-05-26 NOTE — Telephone Encounter (Signed)
Patient has elevated TSH of 6.291 and previously last year was 2.553. She is vitamin D deficient. Vitamin D level is 28 she needs to take 2000 units vitamin D 3 daily. Also her glucose is poorly controlled. Hemoglobin A1c is 9.2% and in November 2014 was 6.9%. She's on metformin 500 mg twice daily. Mean plasma glucose 217 and previously in November was 151. Were going to add Januvia 100 mg daily. She has a followup appointment in a few weeks for depression.

## 2013-05-27 NOTE — Telephone Encounter (Signed)
Patient informed. Januvia is requiring a prior authorization. Patient has a return appointment mid June.

## 2013-06-01 ENCOUNTER — Other Ambulatory Visit: Payer: Self-pay

## 2013-06-01 MED ORDER — LINAGLIPTIN 5 MG PO TABS: 5.0000 mg | ORAL_TABLET | Freq: Every day | ORAL | Status: DC

## 2013-07-06 ENCOUNTER — Ambulatory Visit: Payer: 59 | Admitting: Internal Medicine

## 2013-07-19 ENCOUNTER — Ambulatory Visit: Payer: 59 | Admitting: Internal Medicine

## 2013-07-22 ENCOUNTER — Other Ambulatory Visit: Payer: Self-pay | Admitting: Internal Medicine

## 2013-08-06 ENCOUNTER — Telehealth: Payer: Self-pay | Admitting: Internal Medicine

## 2013-08-06 ENCOUNTER — Encounter: Payer: Self-pay | Admitting: Internal Medicine

## 2013-08-06 ENCOUNTER — Other Ambulatory Visit: Payer: Self-pay | Admitting: Internal Medicine

## 2013-08-06 ENCOUNTER — Ambulatory Visit (INDEPENDENT_AMBULATORY_CARE_PROVIDER_SITE_OTHER): Payer: 59 | Admitting: Internal Medicine

## 2013-08-06 VITALS — BP 126/82 | HR 84 | Wt 311.0 lb

## 2013-08-06 DIAGNOSIS — E039 Hypothyroidism, unspecified: Secondary | ICD-10-CM

## 2013-08-06 DIAGNOSIS — F329 Major depressive disorder, single episode, unspecified: Secondary | ICD-10-CM

## 2013-08-06 DIAGNOSIS — F3289 Other specified depressive episodes: Secondary | ICD-10-CM

## 2013-08-06 DIAGNOSIS — E119 Type 2 diabetes mellitus without complications: Secondary | ICD-10-CM

## 2013-08-06 DIAGNOSIS — Z9119 Patient's noncompliance with other medical treatment and regimen: Secondary | ICD-10-CM

## 2013-08-06 DIAGNOSIS — Z91199 Patient's noncompliance with other medical treatment and regimen due to unspecified reason: Secondary | ICD-10-CM

## 2013-08-06 DIAGNOSIS — F32A Depression, unspecified: Secondary | ICD-10-CM

## 2013-08-06 LAB — TSH: TSH: 4.126 u[IU]/mL (ref 0.350–4.500)

## 2013-08-06 LAB — HEMOGLOBIN A1C
HEMOGLOBIN A1C: 8.7 % — AB (ref <5.7)
Mean Plasma Glucose: 203 mg/dL — ABNORMAL HIGH (ref <117)

## 2013-08-06 NOTE — Progress Notes (Signed)
   Subjective:    Patient ID: Carolyn Combs, female    DOB: January 18, 1971, 43 y.o.   MRN: [id number]  HPI Since last visit, she has not purchased home glucose monitoring has not been checking Accu-Cheks. This is disappointing. Says she continues not to be able to get it together. We talked once again about considering gastric bypass surgery. Doesn't seem to be willing to attend seminars to find out about it. Says as she did in May that her sister recently went to work at [facility] and she might be able to enroll in an exercise and weight loss program there. Seems to remain depressed and can't seem to get motivated. She was supposed to contact her insurance coming because Carolyn Combs was not covered by the a.m. She was supposed to find out what alternative was covered but apparently she didn't do that either.    Review of Systems     Objective:   Physical Exam  Skin warm and dry. Nodes none. Chest clear. Cardiac exam regular rate and rhythm. Extremities without edema. Crying when talking about her weight and her situation      Assessment & Plan:  Type 2 diabetes mellitus  Noncompliant with medical regimen  Morbid obesity  Depression  Hypothyroidism-TSH checked today. She was started on thyroid replacement in May.  Plan: Patient must contact her insurance company and see what will be covered. I think it may be Tradjenta 5 mg daily. Return in September for followup. Encouraged diet exercise and weight loss.

## 2013-08-06 NOTE — Telephone Encounter (Signed)
Will note pt will take Tradjenta.

## 2013-08-06 NOTE — Patient Instructions (Addendum)
Patient is to purchase home Accu-Chek machine and check Accu-Cheks at least twice daily. Prescribed Tradjenta 5 mg daily. Needs to followup September 28. Needs to consider diet exercise and weight loss program. Edwena Bunde talked about this previously.

## 2013-08-07 LAB — MICROALBUMIN, URINE: MICROALB UR: 1.91 mg/dL — AB (ref 0.00–1.89)

## 2013-08-09 ENCOUNTER — Other Ambulatory Visit: Payer: Self-pay

## 2013-08-09 MED ORDER — CLONAZEPAM 0.5 MG PO TABS: 0.5000 mg | ORAL_TABLET | Freq: Two times a day (BID) | ORAL | Status: DC | PRN

## 2013-08-09 MED ORDER — LEVOTHYROXINE SODIUM 100 MCG PO TABS: ORAL_TABLET | ORAL | Status: DC

## 2013-08-15 ENCOUNTER — Other Ambulatory Visit: Payer: Self-pay | Admitting: Internal Medicine

## 2013-10-11 ENCOUNTER — Other Ambulatory Visit: Payer: Self-pay | Admitting: Internal Medicine

## 2013-10-12 ENCOUNTER — Other Ambulatory Visit: Payer: Self-pay | Admitting: Internal Medicine

## 2013-10-18 ENCOUNTER — Ambulatory Visit: Payer: 59 | Admitting: Internal Medicine

## 2013-10-28 ENCOUNTER — Encounter: Payer: Self-pay | Admitting: Internal Medicine

## 2013-10-28 ENCOUNTER — Ambulatory Visit (INDEPENDENT_AMBULATORY_CARE_PROVIDER_SITE_OTHER): Payer: 59 | Admitting: Internal Medicine

## 2013-10-28 VITALS — BP 138/88 | HR 77 | Temp 97.7°F | Wt 312.0 lb

## 2013-10-28 DIAGNOSIS — Z23 Encounter for immunization: Secondary | ICD-10-CM

## 2013-10-28 DIAGNOSIS — E039 Hypothyroidism, unspecified: Secondary | ICD-10-CM

## 2013-10-28 DIAGNOSIS — F329 Major depressive disorder, single episode, unspecified: Secondary | ICD-10-CM

## 2013-10-28 DIAGNOSIS — F418 Other specified anxiety disorders: Secondary | ICD-10-CM

## 2013-10-28 DIAGNOSIS — I10 Essential (primary) hypertension: Secondary | ICD-10-CM

## 2013-10-28 DIAGNOSIS — E119 Type 2 diabetes mellitus without complications: Secondary | ICD-10-CM

## 2013-10-28 DIAGNOSIS — J069 Acute upper respiratory infection, unspecified: Secondary | ICD-10-CM

## 2013-10-28 DIAGNOSIS — F419 Anxiety disorder, unspecified: Secondary | ICD-10-CM

## 2013-10-28 LAB — HEMOGLOBIN A1C
Hgb A1c MFr Bld: 7.2 % — ABNORMAL HIGH (ref <5.7)
Mean Plasma Glucose: 160 mg/dL — ABNORMAL HIGH (ref <117)

## 2013-10-28 LAB — TSH: TSH: 2.379 u[IU]/mL (ref 0.350–4.500)

## 2013-10-28 NOTE — Progress Notes (Signed)
   Subjective:    Patient ID: Carolyn Combs, female    DOB: [birth year], 43 y.o.   MRN: [id number]  HPI  Had  Uterine polyp removed by GYN recently. Dr. Teryl Lucy at [facility] will be doing hysteroscopy as well as  D and C later this month. Apparently there were some atypical cells on path with polyp.Never had abnormal Pap.  Has not lost weight. Has URI symptoms today. No fever. Rhinorrhea. Coughing and ears hurt. Really here to follow up on Diabetes and hypothyroidism in addition to depression. Feels better from depression standpoint. Hemoglobin A1c has improved to 7.2% with oral medication. TSH is within normal limits. Doesn't seem to be quite as depressed as previously. Says children are doing well. Hypothyroidism was diagnosed in May 2015  Review of Systems     Objective:   Physical Exam Skin warm and dry. Nodes none. TMs and pharynx are clear. Neck is supple without adenopathy. Chest clear. Extremities without edema. See diabetic foot exam.       Assessment & Plan:  Diabetes mellitus-improved with oral agent  Hypertension-stable  Hyperlipidemia  Morbid obesity-needs to be motivated to diet and exercise  Metabolic syndrome  Anxiety depression-stable  Hypothyroidism-stable on thyroid replacement  Acute URI  Plan: Return in May 2016 for physical exam and continue same medications. Reminded about diabetic eye exam.

## 2013-11-30 ENCOUNTER — Other Ambulatory Visit: Payer: Self-pay | Admitting: Internal Medicine

## 2013-12-18 DIAGNOSIS — E039 Hypothyroidism, unspecified: Secondary | ICD-10-CM | POA: Insufficient documentation

## 2013-12-18 NOTE — Patient Instructions (Signed)
Continue same medications and return in May for physical examination. Encouraged diet exercise and weight loss. Needs to be better motivated to do so.

## 2014-01-24 ENCOUNTER — Other Ambulatory Visit: Payer: Self-pay | Admitting: Internal Medicine

## 2014-02-08 ENCOUNTER — Other Ambulatory Visit: Payer: Self-pay | Admitting: *Deleted

## 2014-02-08 MED ORDER — LINAGLIPTIN 5 MG PO TABS: 5.0000 mg | ORAL_TABLET | Freq: Every day | ORAL | Status: DC

## 2014-02-08 NOTE — Telephone Encounter (Signed)
Refill sent for Tradjenta 5 mg disp #90 with 3 refills

## 2014-03-23 ENCOUNTER — Other Ambulatory Visit: Payer: Self-pay | Admitting: Internal Medicine

## 2014-04-11 ENCOUNTER — Other Ambulatory Visit: Payer: Self-pay | Admitting: Internal Medicine

## 2014-05-31 ENCOUNTER — Telehealth: Payer: Self-pay | Admitting: Internal Medicine

## 2014-05-31 ENCOUNTER — Encounter: Payer: Self-pay | Admitting: Internal Medicine

## 2014-05-31 ENCOUNTER — Encounter: Payer: 59 | Admitting: Internal Medicine

## 2014-05-31 NOTE — Telephone Encounter (Signed)
Missed CPE appointment today. Was called by staff yesterday and message left regarding appointment. Letter to pt. Re missed appt and would be charged $50 if misses appt again without 24 cancellation

## 2014-06-01 ENCOUNTER — Ambulatory Visit: Payer: 59 | Admitting: Pulmonary Disease

## 2014-06-17 ENCOUNTER — Other Ambulatory Visit: Payer: Self-pay | Admitting: Internal Medicine

## 2014-06-21 ENCOUNTER — Other Ambulatory Visit: Payer: Self-pay | Admitting: Internal Medicine

## 2014-07-10 ENCOUNTER — Other Ambulatory Visit: Payer: Self-pay | Admitting: Internal Medicine

## 2014-07-11 ENCOUNTER — Other Ambulatory Visit: Payer: Self-pay | Admitting: *Deleted

## 2014-07-11 MED ORDER — METFORMIN HCL 500 MG PO TABS: ORAL_TABLET | ORAL | Status: DC

## 2014-07-11 NOTE — Telephone Encounter (Signed)
Metformin refilled for 30 day supply

## 2014-08-08 ENCOUNTER — Ambulatory Visit (INDEPENDENT_AMBULATORY_CARE_PROVIDER_SITE_OTHER): Payer: 59 | Admitting: Internal Medicine

## 2014-08-08 ENCOUNTER — Encounter: Payer: Self-pay | Admitting: Internal Medicine

## 2014-08-08 ENCOUNTER — Other Ambulatory Visit: Payer: Self-pay | Admitting: Internal Medicine

## 2014-08-08 ENCOUNTER — Other Ambulatory Visit: Payer: 59 | Admitting: Internal Medicine

## 2014-08-08 VITALS — BP 128/78 | HR 92 | Ht 65.0 in | Wt 311.0 lb

## 2014-08-08 DIAGNOSIS — R748 Abnormal levels of other serum enzymes: Secondary | ICD-10-CM

## 2014-08-08 DIAGNOSIS — I10 Essential (primary) hypertension: Secondary | ICD-10-CM

## 2014-08-08 DIAGNOSIS — E039 Hypothyroidism, unspecified: Secondary | ICD-10-CM

## 2014-08-08 DIAGNOSIS — E8881 Metabolic syndrome: Secondary | ICD-10-CM | POA: Diagnosis not present

## 2014-08-08 DIAGNOSIS — E119 Type 2 diabetes mellitus without complications: Secondary | ICD-10-CM

## 2014-08-08 DIAGNOSIS — K219 Gastro-esophageal reflux disease without esophagitis: Secondary | ICD-10-CM | POA: Diagnosis not present

## 2014-08-08 DIAGNOSIS — Z0189 Encounter for other specified special examinations: Secondary | ICD-10-CM | POA: Diagnosis not present

## 2014-08-08 DIAGNOSIS — F419 Anxiety disorder, unspecified: Secondary | ICD-10-CM

## 2014-08-08 DIAGNOSIS — E559 Vitamin D deficiency, unspecified: Secondary | ICD-10-CM

## 2014-08-08 DIAGNOSIS — I519 Heart disease, unspecified: Secondary | ICD-10-CM

## 2014-08-08 DIAGNOSIS — E785 Hyperlipidemia, unspecified: Secondary | ICD-10-CM | POA: Diagnosis not present

## 2014-08-08 DIAGNOSIS — F418 Other specified anxiety disorders: Secondary | ICD-10-CM

## 2014-08-08 DIAGNOSIS — K760 Fatty (change of) liver, not elsewhere classified: Secondary | ICD-10-CM | POA: Diagnosis not present

## 2014-08-08 DIAGNOSIS — G4733 Obstructive sleep apnea (adult) (pediatric): Secondary | ICD-10-CM | POA: Diagnosis not present

## 2014-08-08 DIAGNOSIS — F32A Depression, unspecified: Secondary | ICD-10-CM

## 2014-08-08 DIAGNOSIS — F329 Major depressive disorder, single episode, unspecified: Secondary | ICD-10-CM

## 2014-08-08 DIAGNOSIS — Z0000 Encounter for general adult medical examination without abnormal findings: Secondary | ICD-10-CM

## 2014-08-08 LAB — POCT URINALYSIS DIPSTICK
BILIRUBIN UA: NEGATIVE
Glucose, UA: NEGATIVE
KETONES UA: NEGATIVE
NITRITE UA: NEGATIVE
PROTEIN UA: NEGATIVE
RBC UA: NEGATIVE
Spec Grav, UA: 1.01
Urobilinogen, UA: 0.2
pH, UA: 6

## 2014-08-08 LAB — CBC WITH DIFFERENTIAL/PLATELET
BASOS PCT: 0 % (ref 0–1)
Basophils Absolute: 0 10*3/uL (ref 0.0–0.1)
Eosinophils Absolute: 0.1 10*3/uL (ref 0.0–0.7)
Eosinophils Relative: 2 % (ref 0–5)
HCT: 35.8 % — ABNORMAL LOW (ref 36.0–46.0)
Hemoglobin: 11.5 g/dL — ABNORMAL LOW (ref 12.0–15.0)
Lymphocytes Relative: 30 % (ref 12–46)
Lymphs Abs: 1.4 10*3/uL (ref 0.7–4.0)
MCH: 26.7 pg (ref 26.0–34.0)
MCHC: 32.1 g/dL (ref 30.0–36.0)
MCV: 83.3 fL (ref 78.0–100.0)
MPV: 10.8 fL (ref 8.6–12.4)
Monocytes Absolute: 0.3 10*3/uL (ref 0.1–1.0)
Monocytes Relative: 7 % (ref 3–12)
NEUTROS ABS: 2.9 10*3/uL (ref 1.7–7.7)
Neutrophils Relative %: 61 % (ref 43–77)
PLATELETS: 173 10*3/uL (ref 150–400)
RBC: 4.3 MIL/uL (ref 3.87–5.11)
RDW: 15.9 % — ABNORMAL HIGH (ref 11.5–15.5)
WBC: 4.8 10*3/uL (ref 4.0–10.5)

## 2014-08-08 LAB — COMPREHENSIVE METABOLIC PANEL
ALK PHOS: 89 U/L (ref 39–117)
ALT: 84 U/L — ABNORMAL HIGH (ref 0–35)
AST: 72 U/L — ABNORMAL HIGH (ref 0–37)
Albumin: 4.2 g/dL (ref 3.5–5.2)
BUN: 17 mg/dL (ref 6–23)
CALCIUM: 10 mg/dL (ref 8.4–10.5)
CHLORIDE: 102 meq/L (ref 96–112)
CO2: 25 mEq/L (ref 19–32)
Creat: 0.69 mg/dL (ref 0.50–1.10)
Glucose, Bld: 222 mg/dL — ABNORMAL HIGH (ref 70–99)
Potassium: 4.2 mEq/L (ref 3.5–5.3)
Sodium: 139 mEq/L (ref 135–145)
Total Bilirubin: 0.5 mg/dL (ref 0.2–1.2)
Total Protein: 7 g/dL (ref 6.0–8.3)

## 2014-08-08 LAB — LIPID PANEL
Cholesterol: 206 mg/dL — ABNORMAL HIGH (ref 0–200)
HDL: 49 mg/dL (ref >=46)
LDL Cholesterol: 129 mg/dL — ABNORMAL HIGH (ref 0–99)
TRIGLYCERIDES: 139 mg/dL (ref <150)
Total CHOL/HDL Ratio: 4.2 Ratio
VLDL: 28 mg/dL (ref 0–40)

## 2014-08-08 LAB — HEMOGLOBIN A1C
HEMOGLOBIN A1C: 9 % — AB (ref <5.7)
Mean Plasma Glucose: 212 mg/dL — ABNORMAL HIGH (ref <117)

## 2014-08-08 LAB — TSH: TSH: 5.363 u[IU]/mL — ABNORMAL HIGH (ref 0.350–4.500)

## 2014-08-08 MED ORDER — VILAZODONE HCL 40 MG PO TABS: 40.0000 mg | ORAL_TABLET | Freq: Every day | ORAL | Status: DC

## 2014-08-08 NOTE — Patient Instructions (Signed)
Continue same meds and return in 6 months.  Labs pending. Need diabetic eye exam.

## 2014-08-08 NOTE — Progress Notes (Signed)
   Subjective:    Patient ID: Carolyn Combs, female    DOB: [birth year], 44 y.o.   MRN: [id number]  HPI Here for lab draw. PE later today    Review of Systems     Objective:   Physical Exam        Assessment & Plan:

## 2014-08-08 NOTE — Progress Notes (Signed)
   Subjective:    Patient ID: Carolyn Combs, female    DOB: 1970-07-14, 44 y.o.   MRN: [id number]  HPI 44 year old  White Female  for health maintenance and evalution of medical issues. History of hypertension, hyperlipidemia, metabolic syndrome, morbid obesity, type 2 diabetes mellitus, anxiety depression, elevated liver functions due to fatty liver, constipation, GE reflux, obstructive sleep apnea.  In 2013 she took attention deficit medication because she was having difficulty concentrating at work. She also took Pristiq and was subsequently changed to [place]. Has not taken attention deficit medication in some time.  Had hysterectomy in December for fibroids at [facility].  Had mammogram at imaging center [facility] December 2015.  Past medical history: Hospitalized with RMSF 1977. Childbirth: 2003 & 2004. Fractured left orbit due to softball accident in 1992 which required surgery. Wisdom teeth extraction. History of vaginal sling procedure for incontinence  Social history: 2 children. Is divorced. Patient resides near [place] and works for [employer].  Family history: Father in good health. Mother had history of breast cancer, COPD, hypertension. Paternal grandmother with history of anxiety. Sister in good health.  Review of Systems  Constitutional: Positive for fatigue.  Respiratory: Negative.   Cardiovascular: Negative.   Neurological: Negative.   Psychiatric/Behavioral:       Anxiety and depression       Objective:   Physical Exam  Constitutional: She is oriented to person, place, and time. She appears well-developed and well-nourished. No distress.  HENT:  Head: Normocephalic and atraumatic.  Right Ear: External ear normal.  Left Ear: External ear normal.  Mouth/Throat: Oropharynx is clear and moist. No oropharyngeal exudate.  Eyes: Conjunctivae are normal. Pupils are equal, round, and reactive to light. Right eye exhibits no discharge. Left eye  exhibits no discharge.  Neck: Neck supple. No JVD present. No thyromegaly present.  Cardiovascular: Normal rate, regular rhythm, normal heart sounds and intact distal pulses.   No murmur heard. Pulmonary/Chest: Effort normal and breath sounds normal. She has no wheezes. She has no rales.  Abdominal: Soft. Bowel sounds are normal. She exhibits no distension and no mass. There is no tenderness. There is no rebound and no guarding.  Genitourinary:  Deferred status post hysterectomy  Musculoskeletal: Normal range of motion. She exhibits no edema.  Lymphadenopathy:    She has no cervical adenopathy.  Neurological: She is alert and oriented to person, place, and time. She has normal reflexes. No cranial nerve deficit. Coordination normal.  Skin: Skin is warm and dry. No rash noted. She is not diaphoretic.  Psychiatric: She has a normal mood and affect. Her behavior is normal. Judgment and thought content normal.  Vitals reviewed.         Assessment & Plan:  Morbid obesity-unfortunately has not lost significant amount of weight and doesn't seem to be motivated  Anxiety depression-stable  Essential hypertension-stable  Hyperlipidemia  Obstructive sleep apnea  Elevated liver enzymes due to fatty liver  Controlled type 2 diabetes mellitus  GE reflux  Plan: Continue same medications and return in 6 months. I don't really know how to motivate this patient to take better care of herself.

## 2014-08-09 ENCOUNTER — Telehealth: Payer: Self-pay

## 2014-08-09 LAB — MICROALBUMIN / CREATININE URINE RATIO
CREATININE, URINE: 51.6 mg/dL
Microalb Creat Ratio: 7.8 mg/g (ref 0.0–30.0)
Microalb, Ur: 0.4 mg/dL (ref <2.0)

## 2014-08-09 LAB — VITAMIN D 25 HYDROXY (VIT D DEFICIENCY, FRACTURES): Vit D, 25-Hydroxy: 27 ng/mL — ABNORMAL LOW (ref 30–100)

## 2014-08-09 NOTE — Telephone Encounter (Signed)
Left message on mobile number and work number for patient to call office regarding her lab results.  Her thyroid level is off as well as some others and Dr Renold Genta would like her to be seen to discuss the results.

## 2014-08-19 ENCOUNTER — Other Ambulatory Visit: Payer: Self-pay | Admitting: Internal Medicine

## 2014-10-20 ENCOUNTER — Encounter: Payer: Self-pay | Admitting: Internal Medicine

## 2014-10-20 LAB — HM DIABETES EYE EXAM

## 2014-10-22 ENCOUNTER — Other Ambulatory Visit: Payer: Self-pay | Admitting: Internal Medicine

## 2014-11-01 ENCOUNTER — Other Ambulatory Visit: Payer: Self-pay | Admitting: Internal Medicine

## 2014-12-20 ENCOUNTER — Other Ambulatory Visit: Payer: Self-pay | Admitting: Internal Medicine

## 2015-01-20 ENCOUNTER — Other Ambulatory Visit: Payer: Self-pay | Admitting: Internal Medicine

## 2015-01-21 ENCOUNTER — Other Ambulatory Visit: Payer: Self-pay | Admitting: Internal Medicine

## 2015-02-13 ENCOUNTER — Ambulatory Visit: Payer: 59 | Admitting: Internal Medicine

## 2015-03-17 ENCOUNTER — Other Ambulatory Visit: Payer: Self-pay | Admitting: Internal Medicine

## 2015-04-04 ENCOUNTER — Encounter: Payer: Self-pay | Admitting: Internal Medicine

## 2015-04-04 NOTE — Telephone Encounter (Signed)
LMOM for patient @ [phone] that she'll need to make an appointment for 6 mo f/u with fasting labs.  She had appointment in January and was a NO SHOW for that appointment.  Prior to that was not seen since her CPE in July 2016.  So, patient must be seen for f/u prior to having medication refilled.

## 2015-04-04 NOTE — Telephone Encounter (Signed)
This came to my box as a refill request.  Not sure how it got into my box.  Anyway, can it be refilled?

## 2015-04-04 NOTE — Telephone Encounter (Signed)
Cannot refill until she books appt. Please call her. Has missed several appts

## 2015-04-04 NOTE — Telephone Encounter (Signed)
Call pt has missed appts. Cannot continue to refill meds if missing appts

## 2015-04-05 ENCOUNTER — Other Ambulatory Visit: Payer: Self-pay | Admitting: Internal Medicine

## 2015-04-13 ENCOUNTER — Ambulatory Visit (INDEPENDENT_AMBULATORY_CARE_PROVIDER_SITE_OTHER): Payer: 59 | Admitting: Internal Medicine

## 2015-04-13 ENCOUNTER — Encounter: Payer: Self-pay | Admitting: Internal Medicine

## 2015-04-13 ENCOUNTER — Other Ambulatory Visit: Payer: 59 | Admitting: Internal Medicine

## 2015-04-13 VITALS — BP 146/90 | HR 96 | Temp 97.9°F | Resp 18 | Ht 65.0 in | Wt 300.0 lb

## 2015-04-13 DIAGNOSIS — Z23 Encounter for immunization: Secondary | ICD-10-CM | POA: Diagnosis not present

## 2015-04-13 DIAGNOSIS — F32A Depression, unspecified: Secondary | ICD-10-CM

## 2015-04-13 DIAGNOSIS — E039 Hypothyroidism, unspecified: Secondary | ICD-10-CM

## 2015-04-13 DIAGNOSIS — I10 Essential (primary) hypertension: Secondary | ICD-10-CM

## 2015-04-13 DIAGNOSIS — F329 Major depressive disorder, single episode, unspecified: Secondary | ICD-10-CM

## 2015-04-13 DIAGNOSIS — Z79899 Other long term (current) drug therapy: Secondary | ICD-10-CM

## 2015-04-13 DIAGNOSIS — E119 Type 2 diabetes mellitus without complications: Secondary | ICD-10-CM | POA: Diagnosis not present

## 2015-04-13 DIAGNOSIS — E785 Hyperlipidemia, unspecified: Secondary | ICD-10-CM

## 2015-04-13 LAB — HEMOGLOBIN A1C
Hgb A1c MFr Bld: 8.3 % — ABNORMAL HIGH (ref <5.7)
Mean Plasma Glucose: 192 mg/dL — ABNORMAL HIGH (ref <117)

## 2015-04-13 LAB — LIPID PANEL
CHOL/HDL RATIO: 4.3 ratio (ref <=5.0)
CHOLESTEROL: 233 mg/dL — AB (ref 125–200)
HDL: 54 mg/dL (ref >=46)
LDL CALC: 149 mg/dL — AB (ref <130)
TRIGLYCERIDES: 150 mg/dL — AB (ref <150)
VLDL: 30 mg/dL (ref <30)

## 2015-04-13 LAB — HEPATIC FUNCTION PANEL
ALK PHOS: 104 U/L (ref 33–115)
ALT: 69 U/L — ABNORMAL HIGH (ref 6–29)
AST: 54 U/L — AB (ref 10–30)
Albumin: 4.4 g/dL (ref 3.6–5.1)
BILIRUBIN INDIRECT: 0.5 mg/dL (ref 0.2–1.2)
Bilirubin, Direct: 0.1 mg/dL (ref <=0.2)
TOTAL PROTEIN: 7.6 g/dL (ref 6.1–8.1)
Total Bilirubin: 0.6 mg/dL (ref 0.2–1.2)

## 2015-04-13 LAB — TSH: TSH: 1.97 mIU/L

## 2015-04-13 MED ORDER — VILAZODONE HCL 40 MG PO TABS: 40.0000 mg | ORAL_TABLET | Freq: Every day | ORAL | Status: DC

## 2015-04-13 MED ORDER — AMLODIPINE BESYLATE 5 MG PO TABS: 5.0000 mg | ORAL_TABLET | Freq: Every day | ORAL | Status: DC

## 2015-04-13 MED ORDER — BUPROPION HCL ER (XL) 300 MG PO TB24: 300.0000 mg | ORAL_TABLET | Freq: Every day | ORAL | Status: DC

## 2015-04-13 MED ORDER — RAMIPRIL 10 MG PO CAPS: ORAL_CAPSULE | ORAL | Status: DC

## 2015-04-13 MED ORDER — SIMVASTATIN 40 MG PO TABS: ORAL_TABLET | ORAL | Status: DC

## 2015-04-13 MED ORDER — METFORMIN HCL 500 MG PO TABS: ORAL_TABLET | ORAL | Status: DC

## 2015-04-13 MED ORDER — HYDROCHLOROTHIAZIDE 25 MG PO TABS: ORAL_TABLET | ORAL | Status: DC

## 2015-04-13 NOTE — Progress Notes (Signed)
   Subjective:    Patient ID: [name], female    DOB: [birth year], 45 y.o.   MRN: [id number]  HPI Patient was supposed to be here in January for follow-up of multiple medical issues including hypertension, controlled type 2 diabetes mellitus and depression. She also has obesity. Doesn't seem to be motivated to diet and exercise. Says her depression is stable. Children doing well in school. Busy with children playing ball. Says job stress has gotten better. Continues to work from home for [employer]. Blood pressure slightly elevated at 146 overnight any. I'm adding amlodipine 5 mg daily to her current regimen with follow-up in 3 weeks. Concerned about her morbid obesity as well. She should consider weight loss surgery.    Review of Systems     Objective:   Physical Exam  Skin warm and dry. Nodes none. Neck is supple without JVD thyromegaly or carotid bruits. Chest clear to auscultation. Cardiac exam regular rate and rhythm normal S1 and S2 without murmurs. Extremities without edema.      Assessment & Plan:  Essential hypertension-blood pressure a bit labile at times. Add amlodipine 5 mg daily to current regimen and follow-up in 4 weeks  Controlled type 2 diabetes mellitus. Hemoglobin A1c pending.  History of depression-antidepressants refilled  Health maintenance-Prevnar 13 given today

## 2015-04-13 NOTE — Patient Instructions (Signed)
Add amlodipine 5 mg daily and return in 4 weeks for office visit blood pressure check. Labs drawn and pending. Prevnar 13 given.

## 2015-04-14 LAB — MICROALBUMIN, URINE: MICROALB UR: 0.4 mg/dL

## 2015-04-26 NOTE — Telephone Encounter (Signed)
This encounter was created in error - please disregard.

## 2015-04-27 ENCOUNTER — Other Ambulatory Visit: Payer: Self-pay | Admitting: Internal Medicine

## 2015-05-01 ENCOUNTER — Telehealth: Payer: Self-pay | Admitting: Internal Medicine

## 2015-05-01 NOTE — Telephone Encounter (Signed)
Patient called to ask when her appointment is.  4/20 @ 9:45 a.m.    Patient states that she needs to have an EKG per [facility].  Patient is scheduled for an EGD on 4/12 but they want her to have EKG due to presenting to their office with complaints of chest pain.  Patient has f/u appointment here on 4/20.  Offered to work patient in sooner, but patient states she is going to R/S her EGD for later date and keep the 4/20 appointment with Dr. Renold Genta.  Patient states that she's SURE that this is not heart related chest pain.  States that she has been lifting items and has been exerting herself and this has not been an issue for her.  She has NO SOB and she feels this chest pain is due to her dysphagia and heartburn and food related issues that has been ongoing for a while now.  Therefore, she states that this is not an urgent issue.  I again offered to move her appointment up for her and even ask her if the GI office could do an EKG for her.  Patient states that she wants to see Dr Renold Genta for the issue.  I one more time offered to see her sooner than 4/20 - patient is happy with the 4/20 appointment and states that she will R/S the EGD for after she see's Dr. Renold Genta on 4/20.

## 2015-05-11 ENCOUNTER — Encounter: Payer: Self-pay | Admitting: Internal Medicine

## 2015-05-11 ENCOUNTER — Ambulatory Visit (INDEPENDENT_AMBULATORY_CARE_PROVIDER_SITE_OTHER): Payer: 59 | Admitting: Internal Medicine

## 2015-05-11 VITALS — BP 130/82 | HR 96 | Temp 98.6°F | Resp 20 | Ht 65.0 in | Wt 327.0 lb

## 2015-05-11 DIAGNOSIS — F418 Other specified anxiety disorders: Secondary | ICD-10-CM | POA: Diagnosis not present

## 2015-05-11 DIAGNOSIS — R079 Chest pain, unspecified: Secondary | ICD-10-CM

## 2015-05-11 DIAGNOSIS — F329 Major depressive disorder, single episode, unspecified: Secondary | ICD-10-CM

## 2015-05-11 DIAGNOSIS — F32A Depression, unspecified: Secondary | ICD-10-CM

## 2015-05-11 DIAGNOSIS — E119 Type 2 diabetes mellitus without complications: Secondary | ICD-10-CM | POA: Diagnosis not present

## 2015-05-11 DIAGNOSIS — I10 Essential (primary) hypertension: Secondary | ICD-10-CM | POA: Diagnosis not present

## 2015-05-11 DIAGNOSIS — F419 Anxiety disorder, unspecified: Secondary | ICD-10-CM

## 2015-05-11 NOTE — Patient Instructions (Addendum)
Call if chest pain persists. Proceed with endoscopy evaluation. Return in August for physical exam. Continue same medications.

## 2015-05-11 NOTE — Progress Notes (Signed)
   Subjective:    Patient ID: Carolyn Combs, female    DOB: October 26, 1970, 45 y.o.   MRN: [id number]  HPI Patient here to follow-up on elevated blood pressure. At last visit amlodipine 5 mg daily was added to her previous regimen. She has noticed some dependent edema. She needs to get more exercise and walk more. Sits at home doing just jogged for [place] [employer] and doesn't get much exercise. History of morbid obesity. Reminded her that amlodipine could cause some mild swelling. She is on HCTZ. Do not want to add Lasix at this point in time. Blood pressure was elevated initially when she arrived but when I took it it was well within normal limits. Regard to continue this regimen for nail. She's experience some substernal chest pain recently and saw Dr. Melina Copa, gastroenterologist and is to have endoscopy in the very near future. He suggested an EKG which I think is a good idea. We did this today and it is within normal limits. She did not have any radiation of the pain. There was no diaphoresis. No nausea or vomiting. If pain persist, she will need to see cardiologist. Marya Amsler going to wait and see what gastroenterologist finds.    Review of Systems other than leg swelling no new complaints except says she's been a bit more anxious recently.     Objective:   Physical Exam Skin warm and dry. Nodes none. Neck is supple. Chest clear to auscultation. Cardiac exam regular rate and rhythm normal S1 and S2. Extremities without pitting edema       Assessment & Plan:  Substernal chest pain-to have endoscopy by Dr. Melina Copa. If symptoms persist, will need to see cardiologist. Has multiple risk factors for heart disease  Essential hypertension-seems to be stable on amlodipine, all taste and HCTZ. Continue same medications. She really does need to get exercise which would help with dependent edema. Hot weather will aggravate dependent edema. Drink plenty of fluids.  Controlled type 2 diabetes mellitus  Morbid  obesity  Anxiety depression  Plan: Schedule physical examination for August

## 2015-05-12 ENCOUNTER — Other Ambulatory Visit: Payer: Self-pay | Admitting: Internal Medicine

## 2015-05-13 ENCOUNTER — Other Ambulatory Visit: Payer: Self-pay | Admitting: Internal Medicine

## 2015-05-26 ENCOUNTER — Other Ambulatory Visit: Payer: Self-pay | Admitting: Internal Medicine

## 2015-06-08 ENCOUNTER — Other Ambulatory Visit: Payer: Self-pay | Admitting: Internal Medicine

## 2015-07-11 ENCOUNTER — Encounter: Payer: Self-pay | Admitting: Internal Medicine

## 2015-07-22 LAB — COLOGUARD

## 2015-08-01 ENCOUNTER — Other Ambulatory Visit: Payer: Self-pay | Admitting: Internal Medicine

## 2015-08-04 ENCOUNTER — Other Ambulatory Visit: Payer: Self-pay | Admitting: Internal Medicine

## 2015-08-14 ENCOUNTER — Other Ambulatory Visit: Payer: Self-pay | Admitting: Internal Medicine

## 2015-08-24 ENCOUNTER — Encounter: Payer: 59 | Admitting: Internal Medicine

## 2015-08-24 DIAGNOSIS — Z0000 Encounter for general adult medical examination without abnormal findings: Secondary | ICD-10-CM

## 2015-09-30 ENCOUNTER — Other Ambulatory Visit: Payer: Self-pay | Admitting: Internal Medicine

## 2015-10-01 NOTE — Telephone Encounter (Signed)
Please call pt. She missed CPE in August. Needs to be scheduled before we refill

## 2015-10-02 NOTE — Telephone Encounter (Signed)
Called patient. No answer. Will try later.  

## 2015-10-06 NOTE — Telephone Encounter (Signed)
lmtrc

## 2015-10-06 NOTE — Telephone Encounter (Signed)
LMTRC

## 2015-10-09 ENCOUNTER — Other Ambulatory Visit: Payer: Self-pay | Admitting: Internal Medicine

## 2015-10-10 ENCOUNTER — Other Ambulatory Visit: Payer: Self-pay | Admitting: Internal Medicine

## 2015-10-13 ENCOUNTER — Other Ambulatory Visit: Payer: Self-pay | Admitting: Internal Medicine

## 2015-11-02 ENCOUNTER — Other Ambulatory Visit: Payer: Self-pay | Admitting: Internal Medicine

## 2015-11-03 NOTE — Telephone Encounter (Signed)
Patient must keep appointment December for physical exam

## 2016-01-08 ENCOUNTER — Encounter: Payer: 59 | Admitting: Internal Medicine

## 2016-01-20 ENCOUNTER — Other Ambulatory Visit: Payer: Self-pay | Admitting: Internal Medicine

## 2016-01-22 ENCOUNTER — Other Ambulatory Visit: Payer: Self-pay | Admitting: Internal Medicine

## 2016-02-29 ENCOUNTER — Encounter: Payer: Self-pay | Admitting: Internal Medicine

## 2016-02-29 ENCOUNTER — Ambulatory Visit (INDEPENDENT_AMBULATORY_CARE_PROVIDER_SITE_OTHER): Payer: 59 | Admitting: Internal Medicine

## 2016-02-29 ENCOUNTER — Other Ambulatory Visit: Payer: 59 | Admitting: Internal Medicine

## 2016-02-29 VITALS — BP 118/82 | HR 106 | Temp 98.7°F | Ht 64.0 in | Wt 290.0 lb

## 2016-02-29 DIAGNOSIS — E039 Hypothyroidism, unspecified: Secondary | ICD-10-CM

## 2016-02-29 DIAGNOSIS — E785 Hyperlipidemia, unspecified: Secondary | ICD-10-CM

## 2016-02-29 DIAGNOSIS — F3341 Major depressive disorder, recurrent, in partial remission: Secondary | ICD-10-CM | POA: Diagnosis not present

## 2016-02-29 DIAGNOSIS — J069 Acute upper respiratory infection, unspecified: Secondary | ICD-10-CM | POA: Diagnosis not present

## 2016-02-29 DIAGNOSIS — K760 Fatty (change of) liver, not elsewhere classified: Secondary | ICD-10-CM

## 2016-02-29 DIAGNOSIS — I10 Essential (primary) hypertension: Secondary | ICD-10-CM

## 2016-02-29 DIAGNOSIS — E119 Type 2 diabetes mellitus without complications: Secondary | ICD-10-CM

## 2016-02-29 DIAGNOSIS — E8881 Metabolic syndrome: Secondary | ICD-10-CM | POA: Diagnosis not present

## 2016-02-29 DIAGNOSIS — Z9114 Patient's other noncompliance with medication regimen: Secondary | ICD-10-CM

## 2016-02-29 DIAGNOSIS — K219 Gastro-esophageal reflux disease without esophagitis: Secondary | ICD-10-CM

## 2016-02-29 DIAGNOSIS — B9789 Other viral agents as the cause of diseases classified elsewhere: Secondary | ICD-10-CM

## 2016-02-29 DIAGNOSIS — G4733 Obstructive sleep apnea (adult) (pediatric): Secondary | ICD-10-CM | POA: Diagnosis not present

## 2016-02-29 DIAGNOSIS — Z0000 Encounter for general adult medical examination without abnormal findings: Secondary | ICD-10-CM

## 2016-02-29 DIAGNOSIS — Z1329 Encounter for screening for other suspected endocrine disorder: Secondary | ICD-10-CM

## 2016-02-29 DIAGNOSIS — Z1321 Encounter for screening for nutritional disorder: Secondary | ICD-10-CM

## 2016-02-29 DIAGNOSIS — R748 Abnormal levels of other serum enzymes: Secondary | ICD-10-CM

## 2016-02-29 DIAGNOSIS — E559 Vitamin D deficiency, unspecified: Secondary | ICD-10-CM

## 2016-02-29 LAB — POCT URINALYSIS DIPSTICK
BILIRUBIN UA: NEGATIVE
Ketones, UA: 5
Leukocytes, UA: NEGATIVE
NITRITE UA: NEGATIVE
Protein, UA: NEGATIVE
RBC UA: NEGATIVE
SPEC GRAV UA: 1.015
UROBILINOGEN UA: NEGATIVE
pH, UA: 6

## 2016-02-29 LAB — CBC WITH DIFFERENTIAL/PLATELET
BASOS ABS: 0 {cells}/uL (ref 0–200)
Basophils Relative: 0 %
Eosinophils Absolute: 105 cells/uL (ref 15–500)
Eosinophils Relative: 3 %
HEMATOCRIT: 44.1 % (ref 35.0–45.0)
HEMOGLOBIN: 14.1 g/dL (ref 11.7–15.5)
LYMPHS ABS: 1225 {cells}/uL (ref 850–3900)
Lymphocytes Relative: 35 %
MCH: 26.7 pg — ABNORMAL LOW (ref 27.0–33.0)
MCHC: 32 g/dL (ref 32.0–36.0)
MCV: 83.5 fL (ref 80.0–100.0)
MONO ABS: 350 {cells}/uL (ref 200–950)
MPV: 11.5 fL (ref 7.5–12.5)
Monocytes Relative: 10 %
NEUTROS ABS: 1820 {cells}/uL (ref 1500–7800)
NEUTROS PCT: 52 %
Platelets: 161 10*3/uL (ref 140–400)
RBC: 5.28 MIL/uL — ABNORMAL HIGH (ref 3.80–5.10)
RDW: 14.7 % (ref 11.0–15.0)
WBC: 3.5 10*3/uL — AB (ref 3.8–10.8)

## 2016-02-29 LAB — LIPID PANEL
CHOL/HDL RATIO: 7.4 ratio — AB (ref <5.0)
CHOLESTEROL: 327 mg/dL — AB (ref <200)
HDL: 44 mg/dL — ABNORMAL LOW (ref >50)
LDL CALC: 232 mg/dL — AB (ref <100)
Triglycerides: 255 mg/dL — ABNORMAL HIGH (ref <150)
VLDL: 51 mg/dL — AB (ref <30)

## 2016-02-29 LAB — COMPREHENSIVE METABOLIC PANEL
ALBUMIN: 4.4 g/dL (ref 3.6–5.1)
ALK PHOS: 126 U/L — AB (ref 33–115)
ALT: 105 U/L — AB (ref 6–29)
AST: 78 U/L — AB (ref 10–30)
BILIRUBIN TOTAL: 0.9 mg/dL (ref 0.2–1.2)
BUN: 14 mg/dL (ref 7–25)
CALCIUM: 9.9 mg/dL (ref 8.6–10.2)
CO2: 25 mmol/L (ref 20–31)
CREATININE: 0.61 mg/dL (ref 0.50–1.10)
Chloride: 97 mmol/L — ABNORMAL LOW (ref 98–110)
Glucose, Bld: 339 mg/dL — ABNORMAL HIGH (ref 65–99)
Potassium: 4.1 mmol/L (ref 3.5–5.3)
SODIUM: 136 mmol/L (ref 135–146)
TOTAL PROTEIN: 7.6 g/dL (ref 6.1–8.1)

## 2016-02-29 LAB — MICROALBUMIN / CREATININE URINE RATIO
CREATININE, URINE: 44 mg/dL (ref 20–320)
MICROALB UR: 2.2 mg/dL
MICROALB/CREAT RATIO: 50 ug/mg{creat} — AB (ref <30)

## 2016-02-29 LAB — TSH: TSH: 3.73 mIU/L

## 2016-02-29 MED ORDER — PANTOPRAZOLE SODIUM 40 MG PO TBEC: 40.0000 mg | DELAYED_RELEASE_TABLET | Freq: Every day | ORAL | 2 refills | Status: DC

## 2016-02-29 MED ORDER — GLUCOSE BLOOD VI STRP: ORAL_STRIP | 99 refills | Status: DC

## 2016-02-29 MED ORDER — HYDROCHLOROTHIAZIDE 25 MG PO TABS: 25.0000 mg | ORAL_TABLET | Freq: Every day | ORAL | 1 refills | Status: DC

## 2016-02-29 MED ORDER — VILAZODONE HCL 40 MG PO TABS: 40.0000 mg | ORAL_TABLET | Freq: Every day | ORAL | 0 refills | Status: DC

## 2016-02-29 MED ORDER — SIMVASTATIN 40 MG PO TABS: ORAL_TABLET | ORAL | 1 refills | Status: DC

## 2016-02-29 MED ORDER — LINAGLIPTIN 5 MG PO TABS: 5.0000 mg | ORAL_TABLET | Freq: Every day | ORAL | 0 refills | Status: DC

## 2016-02-29 MED ORDER — METFORMIN HCL 500 MG PO TABS: 500.0000 mg | ORAL_TABLET | Freq: Two times a day (BID) | ORAL | 1 refills | Status: DC

## 2016-02-29 MED ORDER — AZITHROMYCIN 250 MG PO TABS: ORAL_TABLET | ORAL | 0 refills | Status: DC

## 2016-02-29 MED ORDER — RAMIPRIL 10 MG PO CAPS: 10.0000 mg | ORAL_CAPSULE | Freq: Every day | ORAL | 0 refills | Status: DC

## 2016-02-29 MED ORDER — LEVOTHYROXINE SODIUM 100 MCG PO TABS: ORAL_TABLET | ORAL | 1 refills | Status: AC

## 2016-02-29 NOTE — Patient Instructions (Addendum)
Diabetic Medications refilled. Protonix refilled as requested. Lipid medication refilled. Encouraged diet exercise and weight loss. Discontinue Wellbutrin. Return in 6 months. Takes Zithromax Z-Pak for respiratory infection. Please be compliant with medications

## 2016-02-29 NOTE — Progress Notes (Signed)
Subjective:    Patient ID: Carolyn Combs, female    DOB: 02/01/1970, 46 y.o.   MRN: [id number]  HPI  46 year old  White Female Not seen since April 20,2017 for health maintenance exam and evaluation of medical issues. She has issues with compliance. She has run out of some of her medications which is concerning. Vitamin D level is low at 28. She is out of statin medication. Total cholesterol was 327, triglycerides 255 and LDL cholesterol 232. This is the worst I've ever seen her lipids but she is out of her medication. She is a diabetic. Fasting glucose is 339, alkaline phosphatase 126, SGOT 78 and SGPT 105. She likely has fatty liver. She is morbidly obese. Hemoglobin A1c 13.9%. TSH is 3.73.  She did not get flu vaccine. She has had URI symptoms with hoarseness but no fever.  Has run out of diabetic test strips.  She has history of hypertension, hyperlipidemia, metabolic syndrome, morbid obesity, type 2 diabetes mellitus, anxiety depression, elevated liver functions due to fatty liver, constipation, GE reflux and obstructive sleep apnea. She also has hypothyroidism  In 2013 she took attention deficit medication because she was having difficulty concentrating at work. She also takes SSRI medication. No longer takes attention deficit medication.  Had hysterectomy in December 2015 for fibroids at [facility] in [facility] December 2015.  Past medical history: Hospitalized with RMSF 1977. Childbirth in 2003 in 2004. Fractured left orbit due to softball accident in 1992 which required surgery. Wisdom teeth extraction. History of vaginal sling procedure for incontinence.  Social history: 2 children. Is divorced. Patient resides near [place] and works from home with [employer].  Family history: Father in good health. Mother had history of breast cancer COPD and hypertension. Paternal grandmother with history of anxiety. Sister in good  health.    Review of Systems URI symptoms, history of depression but affect seems better than in the past     Objective:   Physical Exam  Constitutional: She is oriented to person, place, and time. She appears well-developed and well-nourished.  HENT:  Head: Normocephalic and atraumatic.  Right Ear: External ear normal.  Left Ear: External ear normal.  Mouth/Throat: Oropharynx is clear and moist. No oropharyngeal exudate.  Eyes: Conjunctivae and EOM are normal. Pupils are equal, round, and reactive to light. Right eye exhibits no discharge. Left eye exhibits no discharge. No scleral icterus.  Neck: Neck supple. No JVD present. No thyromegaly present.  Cardiovascular: Normal rate, regular rhythm, normal heart sounds and intact distal pulses.   No murmur heard. Pulmonary/Chest: No respiratory distress. She has no wheezes. She has no rales.  Breasts normal female  Abdominal: Bowel sounds are normal. She exhibits no mass. There is no tenderness. There is no rebound and no guarding.  Genitourinary:  Genitourinary Comments: Deferred  Musculoskeletal: She exhibits no edema.  Neurological: She is alert and oriented to person, place, and time. She has normal reflexes. No cranial nerve deficit.  Skin: Skin is warm and dry.  Psychiatric: She has a normal mood and affect. Her behavior is normal. Judgment and thought content normal.  Vitals reviewed.         Assessment & Plan:  Morbid obesity Diabetes Depression-Patient requests stopping Wellbutrin. Doesn't think she needs it GERD URI Noncompliance with medication Hypertension GE reflux Hyperlipidemia Metabolic syndrome Hypothyroidism  Plan: Patient needs to be compliant with medications and take better care of herself. I do not know  why she runs out of medication and doesn't keep appointments. This is been frustrating for this office. We were refill her medications. Eye mask in that she return in 6 months for follow-up. 4  respiratory infection symptoms, have prescribed Zithromax Z-Pak. Blood pressure is stable at this time. She is on Viibryd for depression

## 2016-03-01 ENCOUNTER — Telehealth: Payer: Self-pay | Admitting: Internal Medicine

## 2016-03-01 LAB — HEMOGLOBIN A1C
Hgb A1c MFr Bld: 13.9 % — ABNORMAL HIGH (ref <5.7)
Mean Plasma Glucose: 352 mg/dL

## 2016-03-01 LAB — VITAMIN D 25 HYDROXY (VIT D DEFICIENCY, FRACTURES): Vit D, 25-Hydroxy: 28 ng/mL — ABNORMAL LOW (ref 30–100)

## 2016-03-01 MED ORDER — SIMVASTATIN 40 MG PO TABS: ORAL_TABLET | ORAL | 1 refills | Status: DC

## 2016-03-01 NOTE — Telephone Encounter (Signed)
Pharmacy requesting refill on statin medication. Sent 90 day refill yesterday to [facility]. Since six-month refill to local pharmacy today.

## 2016-03-18 DIAGNOSIS — Z9114 Patient's other noncompliance with medication regimen: Secondary | ICD-10-CM | POA: Insufficient documentation

## 2016-03-22 ENCOUNTER — Other Ambulatory Visit: Payer: Self-pay | Admitting: Internal Medicine

## 2016-05-19 ENCOUNTER — Other Ambulatory Visit: Payer: Self-pay | Admitting: Internal Medicine

## 2016-06-27 ENCOUNTER — Other Ambulatory Visit: Payer: Self-pay | Admitting: Internal Medicine

## 2016-08-30 ENCOUNTER — Ambulatory Visit: Payer: 59 | Admitting: Internal Medicine

## 2016-09-02 ENCOUNTER — Other Ambulatory Visit: Payer: Self-pay | Admitting: Internal Medicine

## 2016-09-05 ENCOUNTER — Other Ambulatory Visit: Payer: 59 | Admitting: Internal Medicine

## 2016-09-05 ENCOUNTER — Ambulatory Visit (INDEPENDENT_AMBULATORY_CARE_PROVIDER_SITE_OTHER): Payer: 59 | Admitting: Internal Medicine

## 2016-09-05 ENCOUNTER — Encounter: Payer: Self-pay | Admitting: Internal Medicine

## 2016-09-05 VITALS — BP 136/88 | HR 100 | Temp 98.3°F | Wt 294.0 lb

## 2016-09-05 DIAGNOSIS — I10 Essential (primary) hypertension: Secondary | ICD-10-CM

## 2016-09-05 DIAGNOSIS — E785 Hyperlipidemia, unspecified: Secondary | ICD-10-CM | POA: Diagnosis not present

## 2016-09-05 DIAGNOSIS — R748 Abnormal levels of other serum enzymes: Secondary | ICD-10-CM | POA: Diagnosis not present

## 2016-09-05 DIAGNOSIS — E039 Hypothyroidism, unspecified: Secondary | ICD-10-CM | POA: Diagnosis not present

## 2016-09-05 DIAGNOSIS — E118 Type 2 diabetes mellitus with unspecified complications: Secondary | ICD-10-CM

## 2016-09-05 DIAGNOSIS — K219 Gastro-esophageal reflux disease without esophagitis: Secondary | ICD-10-CM | POA: Diagnosis not present

## 2016-09-05 DIAGNOSIS — E8881 Metabolic syndrome: Secondary | ICD-10-CM | POA: Diagnosis not present

## 2016-09-05 DIAGNOSIS — F3341 Major depressive disorder, recurrent, in partial remission: Secondary | ICD-10-CM

## 2016-09-05 LAB — TSH: TSH: 3.24 m[IU]/L

## 2016-09-06 ENCOUNTER — Telehealth: Payer: Self-pay

## 2016-09-06 LAB — HEPATIC FUNCTION PANEL
ALBUMIN: 4.5 g/dL (ref 3.6–5.1)
ALK PHOS: 101 U/L (ref 33–115)
ALT: 58 U/L — ABNORMAL HIGH (ref 6–29)
AST: 60 U/L — AB (ref 10–35)
BILIRUBIN INDIRECT: 0.7 mg/dL (ref 0.2–1.2)
BILIRUBIN TOTAL: 0.8 mg/dL (ref 0.2–1.2)
Bilirubin, Direct: 0.1 mg/dL (ref <=0.2)
TOTAL PROTEIN: 7.2 g/dL (ref 6.1–8.1)

## 2016-09-06 LAB — LIPID PANEL
Cholesterol: 260 mg/dL — ABNORMAL HIGH (ref <200)
HDL: 49 mg/dL — AB (ref >50)
LDL Cholesterol: 174 mg/dL — ABNORMAL HIGH (ref <100)
TRIGLYCERIDES: 185 mg/dL — AB (ref <150)
Total CHOL/HDL Ratio: 5.3 Ratio — ABNORMAL HIGH (ref <5.0)
VLDL: 37 mg/dL — ABNORMAL HIGH (ref <30)

## 2016-09-06 LAB — MICROALBUMIN / CREATININE URINE RATIO
Creatinine, Urine: 66 mg/dL (ref 20–320)
Microalb Creat Ratio: 138 mcg/mg creat — ABNORMAL HIGH (ref <30)
Microalb, Ur: 9.1 mg/dL

## 2016-09-06 LAB — HEMOGLOBIN A1C
HEMOGLOBIN A1C: 12.8 % — AB (ref <5.7)
MEAN PLASMA GLUCOSE: 321 mg/dL

## 2016-09-06 NOTE — Telephone Encounter (Signed)
Referring over to endocrinology pt is aware and agreed to go.

## 2016-09-15 ENCOUNTER — Other Ambulatory Visit: Payer: Self-pay | Admitting: Internal Medicine

## 2016-09-15 NOTE — Patient Instructions (Signed)
Please see endocrinologist regarding diabetic control, hypertension and hypothyroidism. Referral will be made to Dr. Dwyane Dee.

## 2016-09-15 NOTE — Progress Notes (Signed)
   Subjective:    Patient ID: [name], female    DOB: [birth year], 46 y.o.   MRN: [id number]  HPI 46 year old White Female with morbid obesity, hypertension, diabetes mellitus, depression, anxiety, fatty liver disease, GE reflux, hypothyroidism, sleep apnea hyperlipidemia, metabolic syndrome in today for six-month recheck.  Unfortunately Hemoglobin A1c has not improved. 6 months ago was 13.9 and is now 12.8%. We're going to refer her to endocrinologist for further evaluation and treatment. She admits to not taking good care of herself a watch in her diet. Her TSH is 3.24 and perhaps could be adjusted. Total cholesterol is 260, triglycerides 185 and LDL cholesterol 174. HDL is 49. She has microalbuminuria. Creatinine was normal in February.  She can't really give me a good reason why she is not taking care of herself other than her children take up a lot of her time and at times are trying. She works from home with [employer]. She lives in [place]. Lives near her father. She is divorced. Ex-husband works for EMS here.   Review of Systems see above     Objective:   Physical Exam  Constitutional: She is oriented to person, place, and time. No distress.  HENT:  Head: Normocephalic and atraumatic.  Right Ear: External ear normal.  Left Ear: External ear normal.  Mouth/Throat: Oropharynx is clear and moist.  Eyes: Right eye exhibits no discharge. Left eye exhibits no discharge. No scleral icterus.  Neck: Neck supple. No JVD present. No thyromegaly present.  Cardiovascular: Normal rate and regular rhythm.   Pulmonary/Chest: Effort normal and breath sounds normal. No respiratory distress. She has no wheezes.  Musculoskeletal: She exhibits no edema.  Lymphadenopathy:    She has no cervical adenopathy.  Neurological: She is alert and oriented to person, place, and time. She has normal reflexes.  Skin: She is not diaphoretic.  Psychiatric: She has a normal mood and affect. Her behavior  is normal. Judgment and thought content normal.  Vitals reviewed.         Assessment & Plan:  Poorly controlled diabetes mellitus-not motivated. Fasting glucose is 339  History of fatty liver infiltration however some improvement noted over the past 6 months SGOT was 78 and is now 60. SGPT was 105 and is now 58  History of depression and anxiety  Hypertension-consider changing to ARB. Diastolic blood pressure could be better  GE reflux-treated with PPI  Sleep apnea  Hyperlipidemia  Hypothyroidism-TSH is in the 3 range for the past 6 months consider changing dose  Morbid obesity-BMI is 74.94  Metabolic syndrome  Plan: Refer to Dr. Dwyane Dee for evaluation and treatment. Consider switching her off foraminal. 2 another antihypertensive medication

## 2016-09-16 ENCOUNTER — Telehealth: Payer: Self-pay

## 2016-09-16 NOTE — Telephone Encounter (Signed)
LMTCB

## 2016-09-16 NOTE — Telephone Encounter (Signed)
LMTCB because Dr. Dwyane Dee has been attempted to contact her for an appt and she has failed to return their call.

## 2016-09-17 NOTE — Telephone Encounter (Signed)
10/21/16 is her appt and she is on the waiting list.

## 2016-09-20 ENCOUNTER — Other Ambulatory Visit: Payer: Self-pay | Admitting: Internal Medicine

## 2016-10-20 NOTE — Progress Notes (Signed)
Patient ID: Carolyn Combs, female   DOB: May 28, 1970, 46 y.o.   MRN: [id number]          Reason for Appointment: Consultation for Type 2 Diabetes  Referring physician: [place]    History of Present Illness:          Date of diagnosis of type 2 diabetes mellitus:  2014      Background history:   She was apparently diagnosed to have mild diabetes in 2014 with A1c of 6.9 She was started on metformin and Januvia in 2015 initially and then Januvia was changed to [place] She has been taking the same doses subsequently  Recent history:   Non-insulin hypoglycemic drugs the patient is taking are: Metformin 500 mg twice a day, Tradjenta 5 mg daily  Current management, blood sugar patterns and problems identified:  Her blood sugars started going up significantly in early 2018 with A1c of 13.9 with the patient had not been checked with A1c for about a year prior to this  She says her blood sugars this year have been going up to 350 or so but has only been checking blood sugars more recently with her sugars getting out of control  She thinks she was having more fatigue a month or 2 ago but more recently with trying to improve her diet and slight improvement in her blood sugars she feels better  She does not think she has had any increased thirst, urination, blurred vision  Although appears that her weight has gone up she does not think this has changed  Medications have been continued unchanged this year, she does not think she has ever taken more than 500 mg twice a day of metformin and has had no intolerance to this  FASTING blood sugars at home are very consistently over 200  Blood sugars may be somewhat better midday and afternoons sometimes but average blood sugar at any given time is usually well over 200  Has only a couple of readings after evening meal which are also over 200: Highest blood sugar was 392 in the afternoon   In the last few weeks especially the last 2 weeks she has  really cut back on her carbohydrate intake and portions with some improvement in her blood sugars but not wait  She is not motivated to exercise        Side effects from medications have been: None  Compliance with the medical regimen: Fair  Glucose monitoring:  done about 1 times a day         Glucometer: One Touch.       Blood Glucose readings by time of day and averages from meter download:  PREMEAL Breakfast Lunch Dinner Bedtime  Overall   Glucose range: 229-324  187-342   171-392   Median: 282  222  309  261  181    Self-care: The diet that the patient has been following is: tries to limit Carbs, sweets.      Meal times are:   Dinner: 7P.m.  Typical meal intake: Breakfast is Toast and eggs, cheese.  Lunch and dinner usually low fat meat product with vegetables     Usually snacks will be low sugar foods, cottage cheese, celery and carrots            Dietician visit, most recent: none               Exercise: none   Weight history:  Wt Readings from Last 3 Encounters:  10/21/16 Marland Kitchen)  303 lb (137.4 kg)  09/05/16 294 lb (133.4 kg)  02/29/16 290 lb (131.5 kg)    Glycemic control:   Lab Results  Component Value Date   HGBA1C 12.8 (H) 09/05/2016   HGBA1C 13.9 (H) 02/29/2016   HGBA1C 8.3 (H) 04/13/2015   Lab Results  Component Value Date   MICROALBUR 9.1 09/05/2016   LDLCALC 174 (H) 09/05/2016   CREATININE 0.61 02/29/2016   Lab Results  Component Value Date   MICRALBCREAT 138 (H) 09/05/2016    No results found for: FRUCTOSAMINE    Allergies as of 10/21/2016   No Known Allergies     Medication List       Accurate as of 10/21/16  9:11 PM. Always use your most recent med list.          canagliflozin 100 MG Tabs tablet Commonly known as:  INVOKANA 1 tablet before breakfast   Dulaglutide 0.75 MG/0.5ML Sopn Commonly known as:  TRULICITY Inject in the abdominal skin as directed once a week   glucose blood test strip Commonly known as:  ONE TOUCH ULTRA  TEST USE TWICE A DAY   hydrochlorothiazide 25 MG tablet Commonly known as:  HYDRODIURIL TAKE 1 TABLET BY MOUTH  DAILY   levothyroxine 100 MCG tablet Commonly known as:  SYNTHROID, LEVOTHROID Take 1 tablet by mouth  daily on empty stomach one  hour before or 2 hours  after meals. Do not take  with other medications.   metFORMIN 500 MG tablet Commonly known as:  GLUCOPHAGE TAKE 1 TABLET BY MOUTH TWO  TIMES DAILY WITH A MEAL   ONETOUCH DELICA LANCETS 61Y Misc USE TWICE A DAY   pantoprazole 40 MG tablet Commonly known as:  PROTONIX Take 1 tablet (40 mg total) by mouth daily.   ramipril 10 MG capsule Commonly known as:  ALTACE TAKE 1 CAPSULE BY MOUTH  DAILY   simvastatin 40 MG tablet Commonly known as:  ZOCOR TAKE 1 TABLET BY MOUTH AT  BEDTIME   TRADJENTA 5 MG Tabs tablet Generic drug:  linagliptin TAKE 1 TABLET BY MOUTH  DAILY   VIIBRYD 40 MG Tabs Generic drug:  Vilazodone HCl TAKE 1 TABLET BY MOUTH  DAILY       Allergies: No Known Allergies  Past Medical History:  Diagnosis Date  . GERD (gastroesophageal reflux disease)   . Hyperlipidemia   . Hypertension     Past Surgical History:  Procedure Laterality Date  . BLADDER SUSPENSION  2012  . CHOLECYSTECTOMY  05/2005    Family History  Problem Relation Age of Onset  . Heart disease Mother   . Hypertension Mother   . Asthma Mother   . Breast cancer Mother   . Heart disease Maternal Grandfather   . Diabetes Maternal Grandfather   . Cancer Maternal Grandmother        bladder    Social History:  reports that she has never smoked. She has never used smokeless tobacco. She reports that she does not drink alcohol or use drugs.   Review of Systems  Constitutional: Positive for weight gain. Negative for malaise.  HENT: Negative for trouble swallowing.   Eyes: Negative for blurred vision.  Respiratory: Negative for shortness of breath.   Cardiovascular: Negative for leg swelling.  Gastrointestinal: Negative  for diarrhea and abdominal pain.  Endocrine: Negative for menstrual changes and polydipsia.       She is on thyroid supplements with recent TSH normal No recent complaints of fatigue  Genitourinary: Negative  for nocturia.  Musculoskeletal: Positive for joint pain.       She had steroids injection in her first metacarpophalangeal joint from orthopedic surgeon recently for reportedly arthritis  Skin: Negative for rash.  Neurological: Positive for tingling.  Psychiatric/Behavioral: Positive for nervousness.     Lipid history: Taking Zocor for some time, LDL still significantly high    Lab Results  Component Value Date   CHOL 260 (H) 09/05/2016   HDL 49 (L) 09/05/2016   LDLCALC 174 (H) 09/05/2016   TRIG 185 (H) 09/05/2016   CHOLHDL 5.3 (H) 09/05/2016           Hypertension:Has been on treatment with ramipril and HCT  BP Readings from Last 3 Encounters:  10/21/16 140/90  09/05/16 136/88  02/29/16 118/82     Most recent eye exam was 2017  Most recent foot exam: 10/18    LABS:  No visits with results within 1 Week(s) from this visit.  Latest known visit with results is:  Lab on 09/05/2016  Component Date Value Ref Range Status  . Hgb A1c MFr Bld 09/05/2016 12.8* <5.7 % Final   Comment:   For someone without known diabetes, a hemoglobin A1c value of 6.5% or greater indicates that they may have diabetes and this should be confirmed with a follow-up test.   For someone with known diabetes, a value <7% indicates that their diabetes is well controlled and a value greater than or equal to 7% indicates suboptimal control. A1c targets should be individualized based on duration of diabetes, age, comorbid conditions, and other considerations.   Currently, no consensus exists for use of hemoglobin A1c for diagnosis of diabetes for children.     . Mean Plasma Glucose 09/05/2016 321  mg/dL Final  . Cholesterol 09/05/2016 260* <200 mg/dL Final  . Triglycerides 09/05/2016  185* <150 mg/dL Final  . HDL 09/05/2016 49* >50 mg/dL Final  . Total CHOL/HDL Ratio 09/05/2016 5.3* <5.0 Ratio Final  . VLDL 09/05/2016 37* <30 mg/dL Final  . LDL Cholesterol 09/05/2016 174* <100 mg/dL Final  . Total Bilirubin 09/05/2016 0.8  0.2 - 1.2 mg/dL Final  . Bilirubin, Direct 09/05/2016 0.1  <=0.2 mg/dL Final  . Indirect Bilirubin 09/05/2016 0.7  0.2 - 1.2 mg/dL Final  . Alkaline Phosphatase 09/05/2016 101  33 - 115 U/L Final  . AST 09/05/2016 60* 10 - 35 U/L Final  . ALT 09/05/2016 58* 6 - 29 U/L Final  . Total Protein 09/05/2016 7.2  6.1 - 8.1 g/dL Final  . Albumin 09/05/2016 4.5  3.6 - 5.1 g/dL Final  . Creatinine, Urine 09/05/2016 66  20 - 320 mg/dL Final  . Microalb, Ur 09/05/2016 9.1  Not estab mg/dL Final  . Microalb Creat Ratio 09/05/2016 138* <30 mcg/mg creat Final   Comment: The ADA has defined abnormalities in albumin excretion as follows:           Category           Result                            (mcg/mg creatinine)                 Normal:    <30       Microalbuminuria:    30 - 299   Clinical albuminuria:    > or = 300   The ADA recommends that at least two of three specimens collected within a  3 - 6 month period be abnormal before considering a patient to be within a diagnostic category.     Marland Kitchen TSH 09/05/2016 3.24  mIU/L Final   Comment:   Reference Range   > or = 20 Years  0.40-4.50   Pregnancy Range First trimester  0.26-2.66 Second trimester 0.55-2.73 Third trimester  0.43-2.91       Physical Examination:  BP 140/90   Pulse 95   Ht 5\' 5"  (1.651 m)   Wt (!) 303 lb (137.4 kg)   LMP 05/23/2013   SpO2 97%   BMI 50.42 kg/m   GENERAL:         Patient has Severe generalized obesity, no cushingoid features .   HEENT:         Eye exam shows normal external appearance. Fundus exam shows no retinopathy. Oral exam shows normal mucosa .  NECK:   There is no lymphadenopathy Thyroid is not enlarged and no nodules felt.  Carotids are normal to  palpation and no bruit heard LUNGS:         Chest is symmetrical. Lungs are clear to auscultation.Marland Kitchen   HEART:         Heart sounds:  S1 and S2 are normal. No murmur or click heard., no S3 or S4 .   ABDOMEN:   There is no distention present. Liver and spleen are not palpable. No other mass or tenderness present.   NEUROLOGICAL:   Ankle jerks are 1+ bilaterally.    Diabetic Foot Exam - Simple   Simple Foot Form Diabetic Foot exam was performed with the following findings:  Yes 10/21/2016  5:05 PM  Visual Inspection No deformities, no ulcerations, no other skin breakdown bilaterally:  Yes Sensation Testing Intact to touch and monofilament testing bilaterally:  Yes Pulse Check Posterior Tibialis and Dorsalis pulse intact bilaterally:  Yes Comments            Vibration sense is Minimally reduced in distal first toes. MUSCULOSKELETAL:  There is no swelling or deformity of the peripheral joints except left first phalangeal joint  EXTREMITIES:     There is no edema. No skin lesions present.Marland Kitchen SKIN:       No rash or lesions of concern.  no acanthosis or thinning of the skin       ASSESSMENT:  Diabetes type 2, uncontrolled with recent A1c 12.8 and significantly high this year  See history of present illness for detailed discussion of current diabetes management, blood sugar patterns and problems identified    She has a BMI of 50 and has had diabetes for at least 4 years now Currently on only 1000 mg of metformin and Tradjenta Her main difficulty is severe obesity along with generally poor compliance with diet and exercise for several years  Discussed that she needs to be on more effective medications that would also help her lose weight She also needs to maximize the dose of her metformin for benefits on insulin resistance Also expressed to her that she does need to motivate herself to exercise because of significant benefits from this She is monitoring her blood sugars but discussed when to  check them and how often to do these as well as blood sugar targets Currently she is interested in education for diabetes and meal planning   Complications of diabetes: Minimal neuropathy   HYPERLIPIDEMIA: She has marked hyperlipidemia with only mild response to taking Zocor 40 mg and discussed that she will need to be on a combination  regimen or at least high-dose Crestor for better control Will defer to PCP for now  HYPERTENSION: Mild and usually well-controlled although high today because of anxiety  Other medical problems include:  History of anxiety and depression, reflux and hypothyroidism, probable fatty liver  PLAN:    Discussed with the patient the nature of GLP-1 drugs, the action on various organ systems, how they benefit blood glucose control, as well as the benefit of weight loss and  increase satiety . Explained possible side effects, particularly nausea and vomiting that usually resolve over time; discussed safety information in package insert. Demonstrated the medication injection device and injection technique to the patient.  Showed patient where to inject the medication. To start with TRULICITY 6.25 mg dosage weekly for the first 4 weeks Patient brochure on Trulicity with enclosed co-pay card given  INVOKANA 100 mg daily. Discussed action of SGLT 2 drugs on lowering glucose by decreasing kidney absorption of glucose, benefits of weight loss and lower blood pressure, possible side effects including candidiasis and dosage regimen   She'll bring her blood sugar monitor on follow-up  Consultation to be scheduled with dietitian  She will try to do at least walking or other aerobic exercise regularly  Stop Tradjenta  Metformin to be increased to 3 tablets a day for the first week and then 4 tablets daily as long as she does not have any GI side effects  Patient Instructions  Check blood sugars on waking up  3/7 DAYS  Also check blood sugars about 2 hours after a meal  and do this after different meals by rotation  Recommended blood sugar levels on waking up is 90-130 and about 2 hours after meal is 130-160  Please bring your blood sugar monitor to each visit, thank you  Walk daily  Start TRULICITYwith the pen as shown once weekly on the same day of the week.  You may inject in the stomach, thigh or arm as indicated in the brochure given.   You will feel fullness of the stomach with starting the medication and should try to keep the portions at meals small.  You may experience nausea in the first few days which usually gets better over time    If any questions or concerns are present call the office or the  [place] at [phone]. Also visit [email or web address] website for more useful information  Metformin 2 at at dinner, next week 2 in am also     Counseling time on subjects discussed in assessment and plan sections is over 50% of today's 60 minute visit   Consultation note has been sent to the referring physician  [facility] 10/21/2016, 9:11 PM   Note: This office note was prepared with Dragon voice recognition system technology. Any transcriptional errors that result from this process are unintentional.

## 2016-10-21 ENCOUNTER — Ambulatory Visit (INDEPENDENT_AMBULATORY_CARE_PROVIDER_SITE_OTHER): Payer: 59 | Admitting: Endocrinology

## 2016-10-21 ENCOUNTER — Encounter: Payer: Self-pay | Admitting: Endocrinology

## 2016-10-21 VITALS — BP 140/90 | HR 95 | Ht 65.0 in | Wt 303.0 lb

## 2016-10-21 DIAGNOSIS — E782 Mixed hyperlipidemia: Secondary | ICD-10-CM | POA: Diagnosis not present

## 2016-10-21 DIAGNOSIS — E1165 Type 2 diabetes mellitus with hyperglycemia: Secondary | ICD-10-CM | POA: Diagnosis not present

## 2016-10-21 MED ORDER — DULAGLUTIDE 0.75 MG/0.5ML ~~LOC~~ SOAJ: SUBCUTANEOUS | 1 refills | Status: DC

## 2016-10-21 MED ORDER — ONETOUCH DELICA LANCETS 33G MISC: 99 refills | Status: AC

## 2016-10-21 MED ORDER — CANAGLIFLOZIN 100 MG PO TABS: ORAL_TABLET | ORAL | 3 refills | Status: DC

## 2016-10-21 MED ORDER — GLUCOSE BLOOD VI STRP: ORAL_STRIP | 99 refills | Status: DC

## 2016-10-21 NOTE — Patient Instructions (Addendum)
Check blood sugars on waking up  3/7 DAYS  Also check blood sugars about 2 hours after a meal and do this after different meals by rotation  Recommended blood sugar levels on waking up is 90-130 and about 2 hours after meal is 130-160  Please bring your blood sugar monitor to each visit, thank you  Walk daily  Start TRULICITYwith the pen as shown once weekly on the same day of the week.  You may inject in the stomach, thigh or arm as indicated in the brochure given.   You will feel fullness of the stomach with starting the medication and should try to keep the portions at meals small.  You may experience nausea in the first few days which usually gets better over time    If any questions or concerns are present call the office or the  [place] at [phone]. Also visit [email or web address] website for more useful information  Metformin 2 at at dinner, next week 2 in am also

## 2016-11-01 ENCOUNTER — Other Ambulatory Visit: Payer: Self-pay | Admitting: Internal Medicine

## 2016-11-21 ENCOUNTER — Ambulatory Visit (INDEPENDENT_AMBULATORY_CARE_PROVIDER_SITE_OTHER): Payer: 59 | Admitting: Endocrinology

## 2016-11-21 ENCOUNTER — Encounter: Payer: Self-pay | Admitting: Endocrinology

## 2016-11-21 VITALS — BP 126/86 | HR 94 | Ht 65.0 in | Wt 300.2 lb

## 2016-11-21 DIAGNOSIS — E1165 Type 2 diabetes mellitus with hyperglycemia: Secondary | ICD-10-CM

## 2016-11-21 MED ORDER — METFORMIN HCL 1000 MG PO TABS: 1000.0000 mg | ORAL_TABLET | Freq: Two times a day (BID) | ORAL | 3 refills | Status: AC

## 2016-11-21 NOTE — Progress Notes (Signed)
Patient ID: Carolyn Combs, female   DOB: 1970/12/24, 46 y.o.   MRN: [id number]          Reason for Appointment: Follow-up abetes  Referring physician: [place]    History of Present Illness:          Date of diagnosis of type 2 diabetes mellitus:  2014      Background history:   She was apparently diagnosed to have mild diabetes in 2014 with A1c of 6.9 She was started on metformin and Januvia in 2015 initially and then Januvia was changed to [place] She has been taking the same doses subsequently  Recent history:   Non-insulin hypoglycemic drugs the patient is taking are: Metformin 1000 mg twice a day, Invokana 716 mg daily, Trulicity 9.67 mg weekly  Her most recent A1c is 12.8 done in August  Current management, blood sugar patterns and problems identified:  Her blood sugars were averaging 282 fasting when she was seen in consultation on 10/1  She was started on Trulicity 8.93 mg weekly along with Invokana and Tradgenta.  With this her blood sugars have improved dramatically and over the last month have been averaging 145 in the morning  She is also subjectively feeling much better with her energy level and not getting as tired especially in the evenings as before  She has some improved satiety from Trulicity  Her weight is still down 3 pounds despite her improved blood sugars  However she has not started exercising as discussed before  But sugars are not being checked later in the day very much especially recently although only rarely as high as 186  She has no nausea from Trulicity  She did have mild vaginal candidiasis from [place] but she used OTC antifungal cream with relief  She has no difficulty doing the injection of Trulicity in her lateral abdomen        Side effects from medications have been: None  Compliance with the medical regimen: Fair  Glucose monitoring:  done about 1 times a day         Glucometer: One Touch.    Mean values apply above for all  meters except median for One Touch  PRE-MEAL Fasting Lunch Dinner Bedtime Overall  Glucose range:  110-187   127, 164     Mean/median: 144  151    148    POST-MEAL PC Breakfast PC Lunch   Glucose range:     Mean/median: 153          Self-care: The diet that the patient has been following is: tries to limit Carbs, sweets.      Meal times are:   Dinner: 7P.m.  Typical meal intake: Breakfast is Toast and eggs, cheese.  Lunch and dinner usually low fat meat product with vegetables     Usually snacks will be low sugar foods, cottage cheese, celery and carrots            Dietician visit, most recent: none               Exercise:    Weight history:  Wt Readings from Last 3 Encounters:  11/21/16 (!) 300 lb 3.2 oz (136.2 kg)  10/21/16 (!) 303 lb (137.4 kg)  09/05/16 294 lb (133.4 kg)    Glycemic control:   Lab Results  Component Value Date   HGBA1C 12.8 (H) 09/05/2016   HGBA1C 13.9 (H) 02/29/2016   HGBA1C 8.3 (H) 04/13/2015   Lab Results  Component Value Date  MICROALBUR 9.1 09/05/2016   LDLCALC 174 (H) 09/05/2016   CREATININE 0.61 02/29/2016   Lab Results  Component Value Date   MICRALBCREAT 138 (H) 09/05/2016    No results found for: FRUCTOSAMINE    Allergies as of 11/21/2016   No Known Allergies     Medication List       Accurate as of 11/21/16  8:28 PM. Always use your most recent med list.          canagliflozin 100 MG Tabs tablet Commonly known as:  INVOKANA 1 tablet before breakfast   Dulaglutide 0.75 MG/0.5ML Sopn Commonly known as:  TRULICITY Inject in the abdominal skin as directed once a week   glucose blood test strip Commonly known as:  ONE TOUCH ULTRA TEST USE TWICE A DAY   hydrochlorothiazide 25 MG tablet Commonly known as:  HYDRODIURIL TAKE 1 TABLET BY MOUTH  DAILY   levothyroxine 100 MCG tablet Commonly known as:  SYNTHROID, LEVOTHROID Take 1 tablet by mouth  daily on empty stomach one  hour before or 2 hours  after meals. Do  not take  with other medications.   metFORMIN 1000 MG tablet Commonly known as:  GLUCOPHAGE Take 1 tablet (1,000 mg total) by mouth 2 (two) times daily with a meal.   ONETOUCH DELICA LANCETS 52D Misc USE TWICE A DAY   pantoprazole 40 MG tablet Commonly known as:  PROTONIX Take 1 tablet (40 mg total) by mouth daily.   ramipril 10 MG capsule Commonly known as:  ALTACE TAKE 1 CAPSULE BY MOUTH  DAILY   simvastatin 40 MG tablet Commonly known as:  ZOCOR TAKE 1 TABLET BY MOUTH AT  BEDTIME   VIIBRYD 40 MG Tabs Generic drug:  Vilazodone HCl TAKE 1 TABLET BY MOUTH  DAILY       Allergies: No Known Allergies  Past Medical History:  Diagnosis Date  . GERD (gastroesophageal reflux disease)   . Hyperlipidemia   . Hypertension     Past Surgical History:  Procedure Laterality Date  . BLADDER SUSPENSION  2012  . CHOLECYSTECTOMY  05/2005    Family History  Problem Relation Age of Onset  . Heart disease Mother   . Hypertension Mother   . Asthma Mother   . Breast cancer Mother   . Heart disease Maternal Grandfather   . Diabetes Maternal Grandfather   . Cancer Maternal Grandmother        bladder    Social History:  reports that she has never smoked. She has never used smokeless tobacco. She reports that she does not drink alcohol or use drugs.   Review of Systems     Lipid history: Taking Zocor for some time, LDL still significantly high    Lab Results  Component Value Date   CHOL 260 (H) 09/05/2016   HDL 49 (L) 09/05/2016   LDLCALC 174 (H) 09/05/2016   TRIG 185 (H) 09/05/2016   CHOLHDL 5.3 (H) 09/05/2016           Hypertension:Has been on treatment with ramipril 10 mg and HCT  BP Readings from Last 3 Encounters:  11/21/16 126/86  10/21/16 140/90  09/05/16 136/88     Most recent eye exam was 2017  Most recent foot exam: 10/18  Recently has had less tingling in her feet   Physical Examination:  BP 126/86   Pulse 94   Ht 5\' 5"  (1.651 m)   Wt (!)  300 lb 3.2 oz (136.2 kg)   LMP 05/23/2013  SpO2 96%   BMI 49.96 kg/m        ASSESSMENT:  Diabetes type 2, uncontrolled with last A1c 12.8 and significantly high this year  See history of present illness for detailed discussion of current diabetes management, blood sugar patterns and problems identified    She has now been on a regimen of Trulicity, Invokana and increased metformin  With this her blood sugars are improved dramatically and she subjectively also doing better She has no side effects from the new medications except mild transient vaginal candidiasis    HYPERLIPIDEMIA: Needs more aggressive management as discussed on previous visit Will defer to PCP for now  HYPERTENSION: Appears to be better controlled, benefiting from [place]:   She does need to check sugars more often after meals Discussed blood sugar targets at various times No change in current medication regimen   Consultation to be scheduled with dietitian  She will try to do at least walking or other aerobic exercise regularly, she thinks she can start doing this with a friend  Check fructosamine and BMP today New prescription for metformin 1000 mg twice a day given  Patient Instructions  Check blood sugars on waking up 2-3/7 days  Also check blood sugars about 2 hours after a meal and do this after different meals by rotation  Recommended blood sugar levels on waking up is 90-130 and about 2 hours after meal is 130-160  Please bring your blood sugar monitor to each visit, thank you       [facility] 11/21/2016, 8:28 PM   Note: This office note was prepared with Dragon voice recognition system technology. Any transcriptional errors that result from this process are unintentional.

## 2016-11-21 NOTE — Patient Instructions (Signed)
Check blood sugars on waking up 2-3/7 days   Also check blood sugars about 2 hours after a meal and do this after different meals by rotation  Recommended blood sugar levels on waking up is 90-130 and about 2 hours after meal is 130-160  Please bring your blood sugar monitor to each visit, thank you   

## 2016-11-22 ENCOUNTER — Other Ambulatory Visit: Payer: Self-pay | Admitting: Internal Medicine

## 2016-11-22 LAB — BASIC METABOLIC PANEL
BUN: 22 mg/dL (ref 6–23)
CALCIUM: 11.2 mg/dL — AB (ref 8.4–10.5)
CHLORIDE: 99 meq/L (ref 96–112)
CO2: 28 mEq/L (ref 19–32)
CREATININE: 0.66 mg/dL (ref 0.40–1.20)
GFR: 102.78 mL/min (ref >60.00)
Glucose, Bld: 124 mg/dL — ABNORMAL HIGH (ref 70–99)
Potassium: 3.9 mEq/L (ref 3.5–5.1)
Sodium: 138 mEq/L (ref 135–145)

## 2016-11-22 LAB — FRUCTOSAMINE: FRUCTOSAMINE: 257 umol/L (ref 0–285)

## 2016-12-05 ENCOUNTER — Other Ambulatory Visit: Payer: Self-pay | Admitting: Internal Medicine

## 2016-12-09 ENCOUNTER — Other Ambulatory Visit: Payer: Self-pay | Admitting: Internal Medicine

## 2016-12-10 NOTE — Telephone Encounter (Signed)
Verbal order to refill Simvastatin and HCTZ for 6 months by Dr. Renold Genta.  Sending to [place] to send to pharmacy.

## 2016-12-13 ENCOUNTER — Other Ambulatory Visit: Payer: Self-pay | Admitting: Endocrinology

## 2017-01-20 ENCOUNTER — Other Ambulatory Visit: Payer: Self-pay | Admitting: Internal Medicine

## 2017-01-21 ENCOUNTER — Other Ambulatory Visit: Payer: Self-pay | Admitting: Internal Medicine

## 2017-01-22 ENCOUNTER — Ambulatory Visit: Payer: 59 | Admitting: Endocrinology

## 2017-01-29 ENCOUNTER — Ambulatory Visit (INDEPENDENT_AMBULATORY_CARE_PROVIDER_SITE_OTHER): Payer: 59 | Admitting: Endocrinology

## 2017-01-29 ENCOUNTER — Encounter: Payer: Self-pay | Admitting: Endocrinology

## 2017-01-29 ENCOUNTER — Other Ambulatory Visit: Payer: Self-pay

## 2017-01-29 VITALS — BP 130/80 | HR 97 | Ht 65.0 in | Wt 304.0 lb

## 2017-01-29 DIAGNOSIS — E1165 Type 2 diabetes mellitus with hyperglycemia: Secondary | ICD-10-CM | POA: Diagnosis not present

## 2017-01-29 LAB — COMPREHENSIVE METABOLIC PANEL
ALBUMIN: 4.8 g/dL (ref 3.5–5.2)
ALT: 43 U/L — AB (ref 0–35)
AST: 26 U/L (ref 0–37)
Alkaline Phosphatase: 85 U/L (ref 39–117)
BILIRUBIN TOTAL: 0.5 mg/dL (ref 0.2–1.2)
BUN: 17 mg/dL (ref 6–23)
CALCIUM: 10.4 mg/dL (ref 8.4–10.5)
CO2: 28 meq/L (ref 19–32)
CREATININE: 0.81 mg/dL (ref 0.40–1.20)
Chloride: 102 mEq/L (ref 96–112)
GFR: 81.08 mL/min (ref >60.00)
Glucose, Bld: 118 mg/dL — ABNORMAL HIGH (ref 70–99)
Potassium: 3.9 mEq/L (ref 3.5–5.1)
Sodium: 140 mEq/L (ref 135–145)
Total Protein: 7.9 g/dL (ref 6.0–8.3)

## 2017-01-29 LAB — POCT GLYCOSYLATED HEMOGLOBIN (HGB A1C): Hemoglobin A1C: 6.9

## 2017-01-29 LAB — POCT GLUCOSE (DEVICE FOR HOME USE): GLUCOSE FASTING, POC: 120 mg/dL — AB (ref 70–99)

## 2017-01-29 MED ORDER — DULAGLUTIDE 1.5 MG/0.5ML ~~LOC~~ SOAJ: SUBCUTANEOUS | 2 refills | Status: DC

## 2017-01-29 MED ORDER — GLUCOSE BLOOD VI STRP: ORAL_STRIP | 3 refills | Status: AC

## 2017-01-29 NOTE — Addendum Note (Signed)
Addended by: Nile Riggs on: 01/29/2017 05:02 PM   Modules accepted: Orders

## 2017-01-29 NOTE — Patient Instructions (Addendum)
Check blood sugars on waking up 2-3/7    Also check blood sugars about 2 hours after a meal and do this after different meals by rotation  Recommended blood sugar levels on waking up is 90-130 and about 2 hours after meal is 130-160  Please bring your blood sugar monitor to each visit, thank you  Trulicity 1.5mg  weekly

## 2017-01-29 NOTE — Addendum Note (Signed)
Addended by: Nile Riggs on: 01/29/2017 04:05 PM   Modules accepted: Orders

## 2017-01-29 NOTE — Progress Notes (Signed)
Patient ID: Carolyn Combs, female   DOB: 11-25-1970, 47 y.o.   MRN: [id number]          Reason for Appointment: Follow-up abetes  Referring physician: [place]    History of Present Illness:          Date of diagnosis of type 2 diabetes mellitus:  2014      Background history:   She was apparently diagnosed to have mild diabetes in 2014 with A1c of 6.9 She was started on metformin and Januvia in 2015 initially and then Januvia was changed to [place] She has been taking the same doses subsequently  Recent history:   Non-insulin hypoglycemic drugs the patient is taking are: Metformin 1000 mg twice a day, Invokana 629 mg daily, Trulicity 5.28 mg weekly  Her A1c is dramatically better at 6.9, has been around 12-13+ the last 2 times  Current management, blood sugar patterns and problems identified:  She did not bring her monitor as she lost it  She does not remember high readings well but appears to be checking mostly before breakfast and some before suppertime  Her postprandial reading after lunch with a low carbohydrate meal bolus 120 today  She thinks that she has not had as much satiety with Trulicity 4.13 mg recently and has gained about 4 pounds  She is trying to be little more active in general with yard work but not doing any formal activity  Although she is trying to watch her diet with healthier choices she is open to being seen by dietitian  Appears to be interested in weight loss  No recent side effects of candidiasis from Invokana        Side effects from medications have been: None  Compliance with the medical regimen: Fair  Glucose monitoring:  done about 1 times a day         Glucometer: One Touch.    She does not remember her readings: Probably averaging 150      Self-care: The diet that the patient has been following is: tries to limit Carbs, sweets.      Meal times are:   Dinner: 7P.m.  Typical meal intake: Breakfast is Toast and eggs, cheese.     Lunch and dinner usually low fat meat product with vegetables     Usually snacks will be low sugar foods, cottage cheese, celery and carrots            Dietician visit, most recent: none               Exercise:  minimal  Weight history:  Wt Readings from Last 3 Encounters:  01/29/17 (!) 304 lb (137.9 kg)  11/21/16 (!) 300 lb 3.2 oz (136.2 kg)  10/21/16 (!) 303 lb (137.4 kg)    Glycemic control:   Lab Results  Component Value Date   HGBA1C 12.8 (H) 09/05/2016   HGBA1C 13.9 (H) 02/29/2016   HGBA1C 8.3 (H) 04/13/2015   Lab Results  Component Value Date   MICROALBUR 9.1 09/05/2016   LDLCALC 174 (H) 09/05/2016   CREATININE 0.66 11/21/2016   Lab Results  Component Value Date   MICRALBCREAT 138 (H) 09/05/2016    Lab Results  Component Value Date   FRUCTOSAMINE 257 11/21/2016      Allergies as of 01/29/2017   No Known Allergies     Medication List        Accurate as of 01/29/17  3:21 PM. Always use your most recent med list.  canagliflozin 100 MG Tabs tablet Commonly known as:  INVOKANA 1 tablet before breakfast   glucose blood test strip Commonly known as:  ONE TOUCH ULTRA TEST USE TWICE A DAY   hydrochlorothiazide 25 MG tablet Commonly known as:  HYDRODIURIL TAKE 1 TABLET BY MOUTH  DAILY   levothyroxine 100 MCG tablet Commonly known as:  SYNTHROID, LEVOTHROID Take 1 tablet by mouth  daily on empty stomach one  hour before or 2 hours  after meals. Do not take  with other medications.   metFORMIN 1000 MG tablet Commonly known as:  GLUCOPHAGE Take 1 tablet (1,000 mg total) by mouth 2 (two) times daily with a meal.   ONETOUCH DELICA LANCETS 25K Misc USE TWICE A DAY   pantoprazole 40 MG tablet Commonly known as:  PROTONIX TAKE 1 TABLET BY MOUTH  DAILY   ramipril 10 MG capsule Commonly known as:  ALTACE TAKE 1 CAPSULE BY MOUTH  DAILY   simvastatin 40 MG tablet Commonly known as:  ZOCOR TAKE 1 TABLET BY MOUTH AT  BEDTIME   TRADJENTA 5  MG Tabs tablet Generic drug:  linagliptin TAKE 1 TABLET BY MOUTH  DAILY   TRULICITY 2.70 WC/3.7SE Sopn Generic drug:  Dulaglutide INJECT IN THE ABDOMINAL SKIN AS DIRECTED ONCE A WEEK   VIIBRYD 40 MG Tabs Generic drug:  Vilazodone HCl TAKE 1 TABLET BY MOUTH  DAILY       Allergies: No Known Allergies  Past Medical History:  Diagnosis Date  . GERD (gastroesophageal reflux disease)   . Hyperlipidemia   . Hypertension     Past Surgical History:  Procedure Laterality Date  . BLADDER SUSPENSION  2012  . CHOLECYSTECTOMY  05/2005    Family History  Problem Relation Age of Onset  . Heart disease Mother   . Hypertension Mother   . Asthma Mother   . Breast cancer Mother   . Heart disease Maternal Grandfather   . Diabetes Maternal Grandfather   . Cancer Maternal Grandmother        bladder    Social History:  reports that  has never smoked. she has never used smokeless tobacco. She reports that she does not drink alcohol or use drugs.   Review of Systems   Lipid history: Taking Zocor for some time, LDL still significantly high    Lab Results  Component Value Date   CHOL 260 (H) 09/05/2016   HDL 49 (L) 09/05/2016   LDLCALC 174 (H) 09/05/2016   TRIG 185 (H) 09/05/2016   CHOLHDL 5.3 (H) 09/05/2016           Hypertension:Has been on treatment with ramipril 10 mg and HCT 25mg   BP Readings from Last 3 Encounters:  01/29/17 130/80  11/21/16 126/86  10/21/16 140/90     Most recent eye exam was 2017  Most recent foot exam: 10/18  Recently has had less tingling in her feet   Physical Examination:  BP 130/80   Pulse 97   Ht 5\' 5"  (1.651 m)   Wt (!) 304 lb (137.9 kg)   LMP 05/23/2013   SpO2 97%   BMI 50.59 kg/m        ASSESSMENT:  Diabetes type 2, with morbid obesity  See history of present illness for detailed discussion of current diabetes management, blood sugar patterns and problems identified    She has now been on a regimen of Trulicity 8.31  mg, Invokana 100 mg and 2 g metformin  With this her A1c is now  finally below 7%  She has not checked her blood sugars at home recently but also does not remember what ever she had been checking She thinks she can do better with her diet and exercise regimen but needs more motivation also She is requesting a higher dose of Trulicity    HYPERLIPIDEMIA: Needs follow-up with PCP, may need combination treatment as her LDL was still significantly high with Zocor  HYPERTENSION: Now better controlled, benefiting from adding Invokana  HYPERCALCEMIA: She had a high calcium on the last visit and we will need to be repeated, if still high will consider reducing HCTZ and also checking PTH    PLAN:    She was given a new One Touch Verio meter  She will check more readings after meals  Trulicity 1.5 mg weekly, she can use to of the 0.75 mg injections that she has at home together  Regular walking for exercise  Consultation with dietitian  Follow-up in 3 months   There are no Patient Instructions on file for this visit.     Elayne Snare 01/29/2017, 3:21 PM   Note: This office note was prepared with Dragon voice recognition system technology. Any transcriptional errors that result from this process are unintentional.

## 2017-02-05 ENCOUNTER — Other Ambulatory Visit: Payer: Self-pay | Admitting: Internal Medicine

## 2017-02-05 DIAGNOSIS — Z0000 Encounter for general adult medical examination without abnormal findings: Secondary | ICD-10-CM

## 2017-02-05 DIAGNOSIS — I10 Essential (primary) hypertension: Secondary | ICD-10-CM

## 2017-02-05 DIAGNOSIS — E785 Hyperlipidemia, unspecified: Secondary | ICD-10-CM

## 2017-02-05 DIAGNOSIS — E039 Hypothyroidism, unspecified: Secondary | ICD-10-CM

## 2017-02-05 DIAGNOSIS — E119 Type 2 diabetes mellitus without complications: Secondary | ICD-10-CM

## 2017-02-05 DIAGNOSIS — Z1321 Encounter for screening for nutritional disorder: Secondary | ICD-10-CM

## 2017-02-15 ENCOUNTER — Other Ambulatory Visit: Payer: Self-pay | Admitting: Endocrinology

## 2017-02-17 ENCOUNTER — Other Ambulatory Visit: Payer: Self-pay | Admitting: Endocrinology

## 2017-03-06 ENCOUNTER — Encounter: Payer: 59 | Admitting: Internal Medicine

## 2017-03-06 ENCOUNTER — Other Ambulatory Visit: Payer: 59 | Admitting: Internal Medicine

## 2017-03-13 ENCOUNTER — Telehealth: Payer: Self-pay | Admitting: Internal Medicine

## 2017-03-13 MED ORDER — VILAZODONE HCL 40 MG PO TABS: 40.0000 mg | ORAL_TABLET | Freq: Every day | ORAL | 1 refills | Status: AC

## 2017-03-13 NOTE — Telephone Encounter (Signed)
rx was sent in

## 2017-03-13 NOTE — Telephone Encounter (Signed)
Refill x 6 months 

## 2017-03-13 NOTE — Telephone Encounter (Signed)
Requesting refill on Viibryd 40mg .  She has CPE scheduled in April.    Pharmacy:  Local please.  No longer wants to use mail order.  [facility] [place].   Phone #:  [phone].  Thank you.

## 2017-04-05 ENCOUNTER — Other Ambulatory Visit: Payer: Self-pay | Admitting: Internal Medicine

## 2017-04-16 ENCOUNTER — Other Ambulatory Visit: Payer: Self-pay | Admitting: Internal Medicine

## 2017-04-16 DIAGNOSIS — E8881 Metabolic syndrome: Secondary | ICD-10-CM

## 2017-04-16 DIAGNOSIS — E785 Hyperlipidemia, unspecified: Secondary | ICD-10-CM

## 2017-04-16 DIAGNOSIS — I10 Essential (primary) hypertension: Secondary | ICD-10-CM

## 2017-04-16 DIAGNOSIS — E039 Hypothyroidism, unspecified: Secondary | ICD-10-CM

## 2017-04-16 DIAGNOSIS — E119 Type 2 diabetes mellitus without complications: Secondary | ICD-10-CM

## 2017-04-28 ENCOUNTER — Ambulatory Visit: Payer: 59 | Admitting: Endocrinology

## 2017-04-28 ENCOUNTER — Encounter: Payer: 59 | Admitting: Internal Medicine

## 2017-04-28 ENCOUNTER — Other Ambulatory Visit: Payer: 59 | Admitting: Internal Medicine

## 2017-05-02 ENCOUNTER — Other Ambulatory Visit: Payer: Self-pay | Admitting: Internal Medicine

## 2017-05-13 ENCOUNTER — Other Ambulatory Visit: Payer: Self-pay | Admitting: Internal Medicine

## 2017-05-17 ENCOUNTER — Other Ambulatory Visit: Payer: Self-pay | Admitting: Internal Medicine

## 2017-05-28 ENCOUNTER — Ambulatory Visit: Payer: 59 | Admitting: Endocrinology

## 2017-06-09 ENCOUNTER — Other Ambulatory Visit: Payer: Self-pay

## 2017-06-09 MED ORDER — PANTOPRAZOLE SODIUM 40 MG PO TBEC: 40.0000 mg | DELAYED_RELEASE_TABLET | Freq: Every day | ORAL | 0 refills | Status: DC

## 2017-06-09 NOTE — Telephone Encounter (Signed)
Patient called to request a refill on his medication. Pharmacy: [id number] - [place], [place] [phone] (Phone) [phone] (Fax)

## 2017-06-16 ENCOUNTER — Other Ambulatory Visit: Payer: Self-pay | Admitting: Internal Medicine

## 2017-06-18 ENCOUNTER — Other Ambulatory Visit: Payer: Self-pay | Admitting: Endocrinology

## 2017-07-29 ENCOUNTER — Ambulatory Visit: Payer: 59 | Admitting: Endocrinology

## 2017-07-29 DIAGNOSIS — Z0289 Encounter for other administrative examinations: Secondary | ICD-10-CM

## 2017-07-31 ENCOUNTER — Encounter: Payer: 59 | Admitting: Internal Medicine

## 2017-08-19 ENCOUNTER — Encounter: Payer: 59 | Admitting: Internal Medicine

## 2017-08-19 ENCOUNTER — Encounter: Payer: Self-pay | Admitting: Internal Medicine

## 2017-08-19 ENCOUNTER — Telehealth: Payer: Self-pay | Admitting: Internal Medicine

## 2017-08-19 NOTE — Telephone Encounter (Signed)
Pt has a lonstanding history of missing appointments and non-compliance and will be asked to find another primary care physician. Letter will be prepared and mailed. She did not come for CPE today.

## 2017-08-20 ENCOUNTER — Telehealth: Payer: Self-pay | Admitting: Internal Medicine

## 2017-08-20 NOTE — Telephone Encounter (Signed)
Patient dismissed from [facility] by [facility], effective August 19, 2017. Dismissal letter sent out by certified / registered mail.  [email or web address]

## 2017-08-26 ENCOUNTER — Other Ambulatory Visit: Payer: Self-pay | Admitting: Internal Medicine

## 2017-08-26 NOTE — Telephone Encounter (Signed)
Received signed domestic return receipt verifying delivery of certified letter on August 22, 2017. Article number [id number] [phone] [phone]

## 2017-09-08 ENCOUNTER — Other Ambulatory Visit: Payer: Self-pay | Admitting: Internal Medicine

## 2017-09-23 ENCOUNTER — Other Ambulatory Visit: Payer: Self-pay | Admitting: Internal Medicine

## 2017-10-10 ENCOUNTER — Other Ambulatory Visit: Payer: Self-pay | Admitting: Internal Medicine

## 2017-11-08 ENCOUNTER — Other Ambulatory Visit: Payer: Self-pay | Admitting: Internal Medicine

## 2020-12-26 ENCOUNTER — Telehealth: Payer: Self-pay | Admitting: Internal Medicine

## 2020-12-26 NOTE — Telephone Encounter (Signed)
Pt's sleep study has been printed to have faxed at the provided fax number by April. Nothing further needed.
